# Patient Record
Sex: Male | Born: 1970 | Race: White | Hispanic: No | Marital: Single | Smoking: Former smoker
Health system: Southern US, Community
[De-identification: ages and names within clinical notes are randomized; demographics above are authoritative.]

## PROBLEM LIST (undated history)

## (undated) ENCOUNTER — Emergency Department: Discharge status: Absent without leave | Payer: BLUE CROSS/BLUE SHIELD

## (undated) DIAGNOSIS — F17200 Nicotine dependence, unspecified, uncomplicated: Secondary | ICD-10-CM

## (undated) DIAGNOSIS — F1090 Alcohol use, unspecified, uncomplicated: Secondary | ICD-10-CM

## (undated) DIAGNOSIS — K7460 Unspecified cirrhosis of liver: Secondary | ICD-10-CM

## (undated) DIAGNOSIS — K7031 Alcoholic cirrhosis of liver with ascites: Secondary | ICD-10-CM

## (undated) DIAGNOSIS — I499 Cardiac arrhythmia, unspecified: Secondary | ICD-10-CM

## (undated) DIAGNOSIS — D649 Anemia, unspecified: Secondary | ICD-10-CM

## (undated) DIAGNOSIS — F329 Major depressive disorder, single episode, unspecified: Secondary | ICD-10-CM

## (undated) DIAGNOSIS — F419 Anxiety disorder, unspecified: Secondary | ICD-10-CM

## (undated) DIAGNOSIS — I8500 Esophageal varices without bleeding: Secondary | ICD-10-CM

## (undated) DIAGNOSIS — Z8042 Family history of malignant neoplasm of prostate: Secondary | ICD-10-CM

## (undated) DIAGNOSIS — F32A Depression, unspecified: Secondary | ICD-10-CM

## (undated) DIAGNOSIS — IMO0002 Reserved for concepts with insufficient information to code with codable children: Secondary | ICD-10-CM

## (undated) DIAGNOSIS — F1910 Other psychoactive substance abuse, uncomplicated: Secondary | ICD-10-CM

## (undated) DIAGNOSIS — Z5189 Encounter for other specified aftercare: Secondary | ICD-10-CM

## (undated) HISTORY — DX: Alcoholic cirrhosis of liver with ascites: K70.31

## (undated) HISTORY — DX: Anemia, unspecified: D64.9

## (undated) HISTORY — DX: Other psychoactive substance abuse, uncomplicated: F19.10

## (undated) HISTORY — DX: Depression, unspecified: F32.A

## (undated) HISTORY — DX: Encounter for other specified aftercare: Z51.89

## (undated) HISTORY — DX: Anxiety disorder, unspecified: F41.9

## (undated) HISTORY — DX: Esophageal varices without bleeding: I85.00

## (undated) HISTORY — DX: Family history of malignant neoplasm of prostate: Z80.42

## (undated) HISTORY — DX: Reserved for concepts with insufficient information to code with codable children: IMO0002

## (undated) HISTORY — DX: Alcohol use, unspecified, uncomplicated: F10.90

## (undated) HISTORY — DX: Cardiac arrhythmia, unspecified: I49.9

## (undated) HISTORY — DX: Unspecified cirrhosis of liver: K74.60

## (undated) HISTORY — DX: Major depressive disorder, single episode, unspecified: F32.9

## (undated) HISTORY — DX: Nicotine dependence, unspecified, uncomplicated: F17.200

## (undated) HISTORY — PX: UPPER GASTROINTESTINAL ENDOSCOPY: SHX188

---
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
## 1988-04-02 HISTORY — PX: HERNIA REPAIR: SHX51

## 2010-05-02 ENCOUNTER — Emergency Department: Payer: Self-pay | Admitting: Emergency Medicine

## 2011-04-03 HISTORY — PX: HAND SURGERY: SHX662

## 2011-05-03 ENCOUNTER — Ambulatory Visit: Payer: Self-pay | Admitting: Orthopedic Surgery

## 2011-06-13 ENCOUNTER — Encounter: Payer: Self-pay | Admitting: Orthopedic Surgery

## 2011-07-02 ENCOUNTER — Encounter: Payer: Self-pay | Admitting: Orthopedic Surgery

## 2011-08-01 ENCOUNTER — Encounter: Payer: Self-pay | Admitting: Orthopedic Surgery

## 2011-09-01 ENCOUNTER — Encounter: Payer: Self-pay | Admitting: Orthopedic Surgery

## 2012-11-13 ENCOUNTER — Encounter: Payer: Self-pay | Admitting: *Deleted

## 2012-11-27 ENCOUNTER — Encounter: Payer: Self-pay | Admitting: Cardiovascular Disease

## 2012-11-27 ENCOUNTER — Ambulatory Visit (INDEPENDENT_AMBULATORY_CARE_PROVIDER_SITE_OTHER): Payer: BC Managed Care – PPO | Admitting: Cardiovascular Disease

## 2012-11-27 VITALS — BP 140/90 | HR 59 | Ht 70.0 in | Wt 142.8 lb

## 2012-11-27 DIAGNOSIS — R42 Dizziness and giddiness: Secondary | ICD-10-CM

## 2012-11-27 DIAGNOSIS — R0602 Shortness of breath: Secondary | ICD-10-CM

## 2012-11-27 DIAGNOSIS — R002 Palpitations: Secondary | ICD-10-CM

## 2012-11-27 NOTE — Assessment & Plan Note (Signed)
Todd Higgins is a 42 yo who presents with palpitations that are clearly associated with stress.  He has a very stressful job.  He notices these palpitations whenever he has to deal with employees or the public in stressful situations.  He smokes a pack of cigarettes a day. He does not get any exercise.  I've reassured him that his palpitations are benign. He he may be having some premature ventricular contractions or perhaps some premature atrial contractions. I've Advised to stop smoking which would greatly reduce his risk of developing cardiac problems. I've advised him to exercise on a regular basis. I've offered to place a monitor on him or to get an echocardiogram if he feels that he is having additional problems but this point I really don't think that he needs it. We'll see him on an as-needed basis.

## 2012-11-27 NOTE — Progress Notes (Signed)
     Todd Higgins Date of Birth  10-21-1970       [facility] Office [address], [address]  [address] [place], [place]  [address]   [place], [place]  [address] [phone]     [phone]   Fax  [phone]    Fax [phone]  Problem List: 1. Palpitations 2 anxiety  History of Present Illness:  Todd Higgins is a 42 yo who presents for evaluation of paliptations.  He has a stressful job and knows that these symptoms are due to stress.   He has "panic attacks" on occasion.    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  Notes stress with dealing with employees and with the public.   He occasionally will measure his HR and BP at the [facility] down the street and has noted elevations.    He does not get any regular exercise.    He smokes at least 1 ppd.    Current Outpatient Prescriptions  Medication Sig Dispense Refill  . escitalopram (LEXAPRO) 5 MG tablet Take 5 mg by mouth daily.        No current facility-administered medications for this visit.     No Known Allergies  Past Medical History  Diagnosis Date  . Anxiety   . Arrhythmia   . Tobacco dependence   . Family history of prostate cancer     Past Surgical History  Procedure Laterality Date  . Hernia repair    . Appendectomy    . Hand surgery      History  Smoking status  . Current Every Day Smoker -- 1.00 packs/day for 25 years  Smokeless tobacco  . Not on file    History  Alcohol Use  . Yes    Family History  Problem Relation Age of Onset  . Hypertension Mother   . Hypertension Father   . Prostate cancer Father     Reviw of Systems:  Reviewed in the HPI.  All other systems are negative.  Physical Exam: Blood pressure 140/90, pulse 59, height 5\' 10"  (1.778 m), weight 142 lb 12 oz (64.751 kg). General: Well developed, well nourished, in no acute distress.  Head:  Normocephalic, atraumatic, sclera non-icteric, mucus membranes are moist,   Neck: Supple. Carotids are 2 + without bruits. No JVD   Lungs: Clear   Heart: RR, normal S1, S2  Abdomen: Soft, non-tender, non-distended with normal bowel sounds.  Msk:  Strength and tone are normal   Extremities: No clubbing or cyanosis. No edema.  Distal pedal pulses are 2+ and equal    Neuro: CN II - XII intact.  Alert and oriented X 3.   Psych:  Normal   ECG: November 27, 2012:  Sinus bradycardia at 59.  No ST or T wave changes.   Assessment / Plan:

## 2012-11-27 NOTE — Patient Instructions (Addendum)
Your physician recommends that you continue on your current medications as directed. Please refer to the Current Medication list given to you today.   Your physician recommends that you schedule a follow-up appointment as needed  

## 2012-12-04 ENCOUNTER — Telehealth: Payer: Self-pay | Admitting: *Deleted

## 2012-12-04 NOTE — Telephone Encounter (Signed)
Patient wanting to know when he needs his cholestrol checked again. Please advise

## 2013-02-02 ENCOUNTER — Encounter: Payer: Self-pay | Admitting: *Deleted

## 2014-07-25 NOTE — Op Note (Signed)
PATIENT NAME:  Todd Higgins, Todd Higgins MR#:  [id number] DATE OF BIRTH:  1970-12-19  DATE OF PROCEDURE:  05/03/2011  PREOPERATIVE DIAGNOSIS: Left ring finger proximal phalanx fracture.   POSTOPERATIVE DIAGNOSIS: Left ring finger proximal phalanx fracture.   PROCEDURE: Open reduction internal fixation left ring finger proximal phalanx.   SURGEON: Laurene Footman, M.D.   ANESTHESIA: General.    DESCRIPTION OF PROCEDURE: The patient was brought to the Operating Room and after adequate anesthesia was obtained the left arm was prepped and draped in the usual sterile fashion with tourniquet applied to the upper arm. After patient identification and timeout procedures were completed, a mid axial incision was made on the ulnar border of the proximal phalanx and extend dorsal ulnarly with protection of the neurovascular bundle. The lateral bands were dissected off the extensor hood and the proximal portion of the phalanx was exposed. The fracture was quite difficult. There was more comminution than was apparent on preop x-rays and it was very difficult to get anatomic reduction. Eventually, however, a two hole plate was contoured to fit the ulnar side of the proximal phalanx and 2.0 mm screws placed through the phalanx to get rigid fixation without small bone reduction clamp. This was carried out. A K wire was used to help with alignment. After having achieved acceptable alignment and rigid fixation on AP and lateral projections, there did not appear to be any malrotation as there had been preoperatively. The wound was thoroughly irrigated and then anatomic repair carried out with deep closure with repair of the lateral attachment to the extensor hood with 4-0 Vicryl, 4-0 Monocryl subcutaneously, and 5-0 nylon for the skin in a running manner. Digital block was given using 10 mL of 0.5% Marcaine without epinephrine for postoperative analgesia. Sterile dressings of Xeroform, 4 x 4's, and then fluffs followed by a bias wrap  were applied and the tourniquet was let down. Tourniquet time was 107 minutes at 250 mmHg. There were no complications and no specimen.  ____________________________ Laurene Footman, MD [id number] D: 05/03/2011 21:53:22 ET T: 05/04/2011 11:08:22 ET  JOB#: [id number] Christian Mate Tifini Reeder MD ELECTRONICALLY SIGNED 05/04/2011 20:06

## 2015-11-08 ENCOUNTER — Ambulatory Visit (INDEPENDENT_AMBULATORY_CARE_PROVIDER_SITE_OTHER): Payer: BLUE CROSS/BLUE SHIELD | Admitting: Internal Medicine

## 2015-11-08 ENCOUNTER — Encounter: Payer: Self-pay | Admitting: Internal Medicine

## 2015-11-08 VITALS — BP 100/70 | HR 92 | Temp 98.0°F | Ht 69.0 in | Wt 140.0 lb

## 2015-11-08 DIAGNOSIS — Z23 Encounter for immunization: Secondary | ICD-10-CM

## 2015-11-08 DIAGNOSIS — K2920 Alcoholic gastritis without bleeding: Secondary | ICD-10-CM | POA: Diagnosis not present

## 2015-11-08 DIAGNOSIS — IMO0002 Reserved for concepts with insufficient information to code with codable children: Secondary | ICD-10-CM

## 2015-11-08 DIAGNOSIS — E441 Mild protein-calorie malnutrition: Secondary | ICD-10-CM | POA: Diagnosis not present

## 2015-11-08 DIAGNOSIS — F1099 Alcohol use, unspecified with unspecified alcohol-induced disorder: Secondary | ICD-10-CM

## 2015-11-08 DIAGNOSIS — F39 Unspecified mood [affective] disorder: Secondary | ICD-10-CM

## 2015-11-08 LAB — CBC WITH DIFFERENTIAL/PLATELET
BASOS PCT: 0.8 % (ref 0.0–3.0)
Basophils Absolute: 0.1 10*3/uL (ref 0.0–0.1)
EOS ABS: 0 10*3/uL (ref 0.0–0.7)
EOS PCT: 0 % (ref 0.0–5.0)
HEMATOCRIT: 26.5 % — AB (ref 39.0–52.0)
HEMOGLOBIN: 9.1 g/dL — AB (ref 13.0–17.0)
LYMPHS PCT: 9.6 % — AB (ref 12.0–46.0)
Lymphs Abs: 1.4 10*3/uL (ref 0.7–4.0)
MCHC: 34.2 g/dL (ref 30.0–36.0)
MCV: 93.4 fl (ref 78.0–100.0)
Monocytes Absolute: 1.8 10*3/uL — ABNORMAL HIGH (ref 0.1–1.0)
Monocytes Relative: 12.8 % — ABNORMAL HIGH (ref 3.0–12.0)
Neutro Abs: 10.9 10*3/uL — ABNORMAL HIGH (ref 1.4–7.7)
Neutrophils Relative %: 76.8 % (ref 43.0–77.0)
Platelets: 213 10*3/uL (ref 150.0–400.0)
RBC: 2.84 Mil/uL — AB (ref 4.22–5.81)
RDW: 17 % — AB (ref 11.5–15.5)
WBC: 14.2 10*3/uL — AB (ref 4.0–10.5)

## 2015-11-08 LAB — COMPREHENSIVE METABOLIC PANEL
ALBUMIN: 2.7 g/dL — AB (ref 3.5–5.2)
ALK PHOS: 172 U/L — AB (ref 39–117)
ALT: 27 U/L (ref 0–53)
AST: 100 U/L — ABNORMAL HIGH (ref 0–37)
BUN: 6 mg/dL (ref 6–23)
CALCIUM: 8.9 mg/dL (ref 8.4–10.5)
CHLORIDE: 85 meq/L — AB (ref 96–112)
CO2: 29 mEq/L (ref 19–32)
Creatinine, Ser: 0.79 mg/dL (ref 0.40–1.50)
GFR: 111.52 mL/min (ref >60.00)
Glucose, Bld: 119 mg/dL — ABNORMAL HIGH (ref 70–99)
POTASSIUM: 3.9 meq/L (ref 3.5–5.1)
Sodium: 123 mEq/L — ABNORMAL LOW (ref 135–145)
TOTAL PROTEIN: 7.3 g/dL (ref 6.0–8.3)
Total Bilirubin: 2.8 mg/dL — ABNORMAL HIGH (ref 0.2–1.2)

## 2015-11-08 LAB — T4, FREE: FREE T4: 1.5 ng/dL (ref 0.60–1.60)

## 2015-11-08 LAB — PROTIME-INR
INR: 1.6 ratio — ABNORMAL HIGH (ref 0.8–1.0)
PROTHROMBIN TIME: 16.7 s — AB (ref 9.6–13.1)

## 2015-11-08 MED ORDER — BUPROPION HCL ER (XL) 150 MG PO TB24: 150.0000 mg | ORAL_TABLET | Freq: Every day | ORAL | 3 refills | Status: DC

## 2015-11-08 MED ORDER — CHLORDIAZEPOXIDE HCL 25 MG PO CAPS: 25.0000 mg | ORAL_CAPSULE | Freq: Three times a day (TID) | ORAL | 0 refills | Status: DC | PRN

## 2015-11-08 NOTE — Progress Notes (Signed)
Subjective:    Patient ID: Todd Higgins, male    DOB: 07-Aug-1970, 45 y.o.   MRN: [id number]  HPI Here to establish care Hasn't had a primary care doctor for the most part--did see Dr Jacqualine Code at [facility] once  He is alcoholic I took care of his father Todd Higgins He worries about liver damage Did go to [employer] about 2 years ago--got sober Relapsed 2-3 weeks later Determined to stop again Has cut back-- 4 beers for 3 days straight, then 1 (to prevent shakes). Hopes to stop completely now Would typically have 8-10 beers a night Lost license due to DWI  AA in past-- not recently  Does have some abdominal swelling Sharp epigastric pains-- in past 2-3 weeks No meds for this Trying to eat Taking omeprazole also--seems to be helping (can keep food down now) No hematemesis Slight blood in stool--with diarrhea due to decreased eating. No abnormal bleeding --gums, urine, skin (no bruising) No itching  Known anxiety Put on lexapro---made him feel bad and he stopped it Lots of stress  No current outpatient prescriptions on file prior to visit.   No current facility-administered medications on file prior to visit.     No Known Allergies  Past Medical History:  Diagnosis Date  . Alcohol use disorder ([place])   . Anxiety   . Arrhythmia   . Family history of prostate cancer   . Tobacco dependence     Past Surgical History:  Procedure Laterality Date  . APPENDECTOMY  1981  . HAND SURGERY  2013  . HERNIA REPAIR  1990    Family History  Problem Relation Age of Onset  . Hypertension Mother   . Hypertension Father   . Prostate cancer Father   . Alcohol abuse Father   . Mental illness Father   . Heart disease Maternal Grandmother   . Stroke Maternal Grandfather   . Diabetes Maternal Grandfather   . Diabetes Paternal Grandmother   . Drug abuse Maternal Uncle   . Stroke Maternal Uncle   . Prostate cancer Maternal Uncle     Social History   Social History  .  Marital status: Single    Spouse name: N/A  . Number of children: 0  . Years of education: N/A   Occupational History  . 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 Smoking status: Current Every Day Smoker    Packs/day: 1.00    Years: 25.00  . Smokeless tobacco: Never Used  . Alcohol use Yes  . Drug use: No  . Sexual activity: Not on file   Other Topics Concern  . Not on file   Social History Narrative  . No narrative on file   Review of Systems  Constitutional: Positive for unexpected weight change.       Lost weight not eating  HENT: Negative for hearing loss and tinnitus.        Wax on left -- clogs up Some dental problems--no recent dental visit  Eyes:       Some vision changes--new Rx from Dr Gloriann Loan  Needs bifocals  Respiratory: Positive for cough. Negative for chest tightness and shortness of breath.   Cardiovascular: Negative for chest pain and palpitations.  Gastrointestinal: Positive for abdominal pain, blood in stool and diarrhea.  Genitourinary: Negative for dysuria and hematuria.       Slow stream at times More nocturia  Musculoskeletal: Negative for arthralgias, back  pain and joint swelling.  Skin: Negative for rash.  Allergic/Immunologic: Negative for environmental allergies and immunocompromised state.  Neurological: Positive for dizziness. Negative for syncope and light-headedness.  Psychiatric/Behavioral: The patient is nervous/anxious.        Some depression lately No thoughts of suicide Usually sleeps okay---after drinking       Objective:   Physical Exam  Constitutional: He is oriented to person, place, and time. No distress.  Widespread muscle wasting  HENT:  Mouth/Throat: Oropharynx is clear and moist. No oropharyngeal exudate.  Neck: Normal range of motion. Neck supple. No thyromegaly present.  Cardiovascular: Normal rate, regular rhythm, normal heart sounds and intact distal pulses.  Exam reveals no  gallop.   No murmur heard. HR in 90's  Pulmonary/Chest: Effort normal and breath sounds normal. No respiratory distress. He has no wheezes. He has no rales.  Abdominal:  Liver span ~10cm Mild epigastric tenderness  Musculoskeletal: He exhibits no edema or tenderness.  Lymphadenopathy:    He has no cervical adenopathy.  Neurological: He is alert and oriented to person, place, and time.  Skin: No rash noted. No erythema.  Psychiatric:  Mild psychomotor retardation No overt depression          Assessment & Plan:

## 2015-11-08 NOTE — Progress Notes (Signed)
Pre visit review using our clinic review tool, if applicable. No additional management support is needed unless otherwise documented below in the visit note. 

## 2015-11-08 NOTE — Addendum Note (Signed)
Addended by: Lurlean Nanny on: 11/08/2015 01:24 PM   Modules accepted: Orders

## 2015-11-08 NOTE — Assessment & Plan Note (Signed)
Ready to stop Will give some librium to help prevent DTs---no more alcohol AA today and get sponsor

## 2015-11-08 NOTE — Assessment & Plan Note (Signed)
Improved with the PPI Will continue Check liver function, etc

## 2015-11-08 NOTE — Patient Instructions (Signed)
Contact AA and start meetings today. You need a sponsor. No more alcohol--- use the librium if you need to for shakes Continue the ensure-- 1-2 cans a day. You can start eating more normally again. Go to the ER if you have any severe withdrawal symptoms. Continue the omeprazole daily on an empty stomach.

## 2015-11-08 NOTE — Assessment & Plan Note (Signed)
Okay to start eating regular food again Continue the ensure supplements

## 2015-11-08 NOTE — Assessment & Plan Note (Signed)
Some anxiety for years---since Dad's death Mild depression Will start bupropion to help with absinence

## 2015-11-14 ENCOUNTER — Ambulatory Visit (INDEPENDENT_AMBULATORY_CARE_PROVIDER_SITE_OTHER): Payer: BLUE CROSS/BLUE SHIELD | Admitting: Internal Medicine

## 2015-11-14 ENCOUNTER — Encounter: Payer: Self-pay | Admitting: Internal Medicine

## 2015-11-14 VITALS — BP 110/62 | HR 102 | Temp 97.7°F | Wt 149.0 lb

## 2015-11-14 DIAGNOSIS — F1099 Alcohol use, unspecified with unspecified alcohol-induced disorder: Secondary | ICD-10-CM

## 2015-11-14 DIAGNOSIS — F39 Unspecified mood [affective] disorder: Secondary | ICD-10-CM

## 2015-11-14 DIAGNOSIS — K7031 Alcoholic cirrhosis of liver with ascites: Secondary | ICD-10-CM | POA: Diagnosis not present

## 2015-11-14 DIAGNOSIS — IMO0002 Reserved for concepts with insufficient information to code with codable children: Secondary | ICD-10-CM

## 2015-11-14 DIAGNOSIS — K7460 Unspecified cirrhosis of liver: Secondary | ICD-10-CM | POA: Insufficient documentation

## 2015-11-14 MED ORDER — FUROSEMIDE 40 MG PO TABS: 40.0000 mg | ORAL_TABLET | Freq: Every day | ORAL | 0 refills | Status: DC | PRN

## 2015-11-14 NOTE — Assessment & Plan Note (Addendum)
Has been abstinent since last week Set up with AA and a sponsor Discussed--may want to reconsider going to [place] again

## 2015-11-14 NOTE — Progress Notes (Signed)
Pre visit review using our clinic review tool, if applicable. No additional management support is needed unless otherwise documented below in the visit note. 

## 2015-11-14 NOTE — Progress Notes (Signed)
   Subjective:    Patient ID: Todd Higgins, male    DOB: 20-Oct-1970, 45 y.o.   MRN: [id number]  HPI Here for follow up of alcohol abuse Has had a lot of soreness and abdominal bloating Epigastric pain (and all over abdomen)  Some swelling in feet also No SOB Lies flat in bed---no PND Nocturia every hour  Abstinent since last visit To AA 4 times last visit Now has  Did need 5 of the librium  Has been drinking gatorade---hoping electrolytes would help  Current Outpatient Prescriptions on File Prior to Visit  Medication Sig Dispense Refill  . buPROPion (WELLBUTRIN XL) 150 MG 24 hr tablet Take 1 tablet (150 mg total) by mouth daily. 30 tablet 3  . chlordiazePOXIDE (LIBRIUM) 25 MG capsule Take 1 capsule (25 mg total) by mouth 3 (three) times daily as needed for anxiety. 10 capsule 0  . omeprazole (PRILOSEC) 20 MG capsule Take 20 mg by mouth daily as needed.     No current facility-administered medications on file prior to visit.     No Known Allergies  Past Medical History:  Diagnosis Date  . Alcohol use disorder ([place])   . Anxiety   . Arrhythmia   . Family history of prostate cancer   . Tobacco dependence     Past Surgical History:  Procedure Laterality Date  . APPENDECTOMY  1981  . HAND SURGERY  2013  . HERNIA REPAIR  1990    Family History  Problem Relation Age of Onset  . Hypertension Mother   . Hypertension Father   . Prostate cancer Father   . Alcohol abuse Father   . Mental illness Father   . Heart disease Maternal Grandmother   . Stroke Maternal Grandfather   . Diabetes Maternal Grandfather   . Diabetes Paternal Grandmother   . Drug abuse Maternal Uncle   . Stroke Maternal Uncle   . Prostate cancer Maternal Uncle     Social History   Social History  . Marital status: Single    Spouse name: N/A  . Number of children: 0  . Years of education: N/A   Occupational History  . 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 Smoking status: Current Every Day Smoker    Packs/day: 1.00    Years: 25.00  . Smokeless tobacco: Never Used  . Alcohol use Yes  . Drug use: No  . Sexual activity: Not on file   Other Topics Concern  . Not on file   Social History Narrative  . No narrative on file   Review of Systems Weight up 9#--some must be fluid Appetite is okay Not sleeping well Hasn't really been able to go to work---can't lift anything Ears are stopped up No fever No heartburn    Objective:   Physical Exam  Constitutional: No distress.  Very weak--trouble getting on table  HENT:  Canals full of cerumen  Neck: No thyromegaly present.  Cardiovascular: Normal rate, regular rhythm and normal heart sounds.  Exam reveals no gallop.   No murmur heard. Pulmonary/Chest: Effort normal. No respiratory distress. He has no wheezes. He has no rales.  Dullness and decreased breath sounds at bases-- left > right  Musculoskeletal:  2+ pedal edema  Lymphadenopathy:    He has no cervical adenopathy.          Assessment & Plan:

## 2015-11-14 NOTE — Assessment & Plan Note (Signed)
Still very depressed but hard to judge Consider increasing bupropion at next follow up

## 2015-11-14 NOTE — Patient Instructions (Addendum)
Please continue the boost drinks. Avoid salt--including gatorade.. Start a multivitamin with iron every day. Take the furosemide every day that you wake with swelling in your legs and abdomen.

## 2015-11-14 NOTE — Assessment & Plan Note (Signed)
Now apparent with his excess salt intake (all the gatorade). Since no fluid overload last week, will hold off on spironolactone (will just use furosemide prn till fluid gone) Will check ultrasound to be sure no unexpected findings (clearly has ascites and some pleural effusions as well)

## 2015-11-16 ENCOUNTER — Ambulatory Visit
Admission: RE | Admit: 2015-11-16 | Discharge: 2015-11-16 | Disposition: A | Discharge status: Home / Self Care | Payer: BLUE CROSS/BLUE SHIELD | Source: Ambulatory Visit | Attending: Internal Medicine | Admitting: Internal Medicine

## 2015-11-16 DIAGNOSIS — K7031 Alcoholic cirrhosis of liver with ascites: Secondary | ICD-10-CM

## 2015-11-16 DIAGNOSIS — R188 Other ascites: Secondary | ICD-10-CM | POA: Diagnosis not present

## 2015-11-16 DIAGNOSIS — K7460 Unspecified cirrhosis of liver: Secondary | ICD-10-CM | POA: Diagnosis not present

## 2015-11-17 ENCOUNTER — Telehealth: Payer: Self-pay | Admitting: Internal Medicine

## 2015-11-17 NOTE — Telephone Encounter (Signed)
Patient Name: Todd Higgins  DOB: Nov 17, 1970    Initial Comment caller states he has fluid retention, abd bloating and constipation   Nurse Assessment  Nurse: Raphael Gibney, RN, Vanita Ingles Date/Time (Eastern Time): 11/17/2015 10:26:53 AM  Confirm and document reason for call. If symptomatic, describe symptoms. You must click the next button to save text entered. ---Caller states he has fluid retention in his abd. has edema in his feet to about half way his calves. Can't wear his shoes. Having trouble walking due to the swelling and pain. Has pain when he stands in his abd. He has been taking his furosemide. He is constipated. Had BM this am that was hard. thinks his abd swelling is bigger. Says his Librium does not seem to help his anxiety.  Has the patient traveled out of the country within the last 30 days? ---Not Applicable  Does the patient have any new or worsening symptoms? ---Yes  Will a triage be completed? ---Yes  Related visit to physician within the last 2 weeks? ---No  Does the PT have any chronic conditions? (i.e. diabetes, asthma, etc.) ---Yes  List chronic conditions. ---alcohol abuse;  Is this a behavioral health or substance abuse call? ---No     Guidelines    Guideline Title Affirmed Question Affirmed Notes  Leg Swelling and Edema [1] MODERATE leg swelling (e.g., swelling extends up to knees) AND [2] new onset or worsening    Final Disposition User   See Physician within 24 Hours [place], RN, [place]    Comments  Pt states he can not come to the office today as he has no transportation   Referrals  GO TO FACILITY REFUSED   Disagree/Comply: Disagree  Disagree/Comply Reason: Unable to find transportation

## 2015-11-17 NOTE — Telephone Encounter (Signed)
Pt has appt with Dr Silvio Pate on 11/21/15 at 2:15.

## 2015-11-17 NOTE — Telephone Encounter (Signed)
Please ask him to try 2 of the furosemide daily but he needs to still be drinking plenty of fluid. I don't think he should take any more of the librium. I can try to squeeze him in tomorrow if things aren't any better (he should try 2 of the furosemide together now--even if he took the one earlier)

## 2015-11-18 NOTE — Telephone Encounter (Signed)
Please call patient back about how he's to take his medication.  Please call patient back at [phone].

## 2015-11-18 NOTE — Telephone Encounter (Signed)
Patient called back.  I spoke to [facility] and he said to tell patient to take both pills at the same time.  Patient notified.

## 2015-11-18 NOTE — Telephone Encounter (Signed)
Pt was notified of instructions below, he said he will call back if he thinks he needs to be seen today.  He  Todd Higgins he woke up this morning at 6 am with abd pain and he had a small BM and the pain decreased, he thinks he's constipated and would like to know if he can take anything for that.

## 2015-11-21 ENCOUNTER — Ambulatory Visit (INDEPENDENT_AMBULATORY_CARE_PROVIDER_SITE_OTHER): Payer: BLUE CROSS/BLUE SHIELD | Admitting: Internal Medicine

## 2015-11-21 ENCOUNTER — Encounter: Payer: Self-pay | Admitting: Internal Medicine

## 2015-11-21 ENCOUNTER — Telehealth: Payer: Self-pay | Admitting: Internal Medicine

## 2015-11-21 DIAGNOSIS — K7031 Alcoholic cirrhosis of liver with ascites: Secondary | ICD-10-CM | POA: Diagnosis not present

## 2015-11-21 DIAGNOSIS — F39 Unspecified mood [affective] disorder: Secondary | ICD-10-CM | POA: Diagnosis not present

## 2015-11-21 MED ORDER — SPIRONOLACTONE 50 MG PO TABS: 50.0000 mg | ORAL_TABLET | Freq: Every day | ORAL | 1 refills | Status: DC

## 2015-11-21 MED ORDER — BUPROPION HCL ER (XL) 300 MG PO TB24: 300.0000 mg | ORAL_TABLET | Freq: Every day | ORAL | 2 refills | Status: DC

## 2015-11-21 NOTE — Progress Notes (Signed)
Pre visit review using our clinic review tool, if applicable. No additional management support is needed unless otherwise documented below in the visit note. 

## 2015-11-21 NOTE — Assessment & Plan Note (Signed)
Persistent fluid overload--- ascites, pleural effusions Furosemide not helping Will start spironolactone Needs emergent GI evaluation

## 2015-11-21 NOTE — Telephone Encounter (Signed)
Pt scheduled to see Amy Esterwood PA 11/28/15@1 :30pm. Please notify pt of appt.

## 2015-11-21 NOTE — Patient Instructions (Addendum)
Please hold off on the furosemide--don't take for now. Start the spironolactone daily.

## 2015-11-21 NOTE — Telephone Encounter (Signed)
Left message for patient to call and confirm this appointment. °

## 2015-11-21 NOTE — Assessment & Plan Note (Signed)
Ongoing stress Will increase the bupropion as planned Not particularly depressed

## 2015-11-21 NOTE — Progress Notes (Signed)
Subjective:    Patient ID: Todd Higgins, male    DOB: 09/10/1970, 45 y.o.   MRN: [id number]  HPI He is here for follow up of the cirrhosis Mom is with him  No fluid has come off since starting the furosemide Even with 2 together a day Feet are really swollen and painful Not SOB Feels weak though  Still abstinent Not able to make it to AA--- trouble getting around with the swelling Has kept in touch with sponsor  Still having lots of stress "I get my mine wrapped up around things, it stresses me out"  Current Outpatient Prescriptions on File Prior to Visit  Medication Sig Dispense Refill  . buPROPion (WELLBUTRIN XL) 150 MG 24 hr tablet Take 1 tablet (150 mg total) by mouth daily. 30 tablet 3  . chlordiazePOXIDE (LIBRIUM) 25 MG capsule Take 1 capsule (25 mg total) by mouth 3 (three) times daily as needed for anxiety. 10 capsule 0  . furosemide (LASIX) 40 MG tablet Take 1 tablet (40 mg total) by mouth daily as needed. For morning swelling in legs and abdomen 30 tablet 0  . omeprazole (PRILOSEC) 20 MG capsule Take 20 mg by mouth daily as needed.     No current facility-administered medications on file prior to visit.     No Known Allergies  Past Medical History:  Diagnosis Date  . Alcohol use disorder ([place])   . Anxiety   . Arrhythmia   . Family history of prostate cancer   . Tobacco dependence     Past Surgical History:  Procedure Laterality Date  . APPENDECTOMY  1981  . HAND SURGERY  2013  . HERNIA REPAIR  1990    Family History  Problem Relation Age of Onset  . Hypertension Mother   . Hypertension Father   . Prostate cancer Father   . Alcohol abuse Father   . Mental illness Father   . Heart disease Maternal Grandmother   . Stroke Maternal Grandfather   . Diabetes Maternal Grandfather   . Diabetes Paternal Grandmother   . Drug abuse Maternal Uncle   . Stroke Maternal Uncle   . Prostate cancer Maternal Uncle     Social History   Social History  .  Marital status: Single    Spouse name: N/A  . Number of children: 0  . Years of education: N/A   Occupational History  . 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 Smoking status: Current Every Day Smoker    Packs/day: 1.00    Years: 25.00  . Smokeless tobacco: Never Used  . Alcohol use Yes  . Drug use: No  . Sexual activity: Not on file   Other Topics Concern  . Not on file   Social History Narrative  . No narrative on file   Review of Systems Has rash on left lateral ankle Eating better Real food now Avoiding all salt in foods Constipation now-- using biscodyl suppository prn Trouble sleeping at night--will have urinary urgency, then not have to go. Sleeping better in recliner now Did start a daily multivitamin    Objective:   Physical Exam  Constitutional: He appears well-developed and well-nourished. No distress.  Neck: Normal range of motion. Neck supple.  Cardiovascular: Normal rate and regular rhythm.  Exam reveals no gallop.   Soft systolic murmur  Pulmonary/Chest: Effort normal. No respiratory distress. He has no wheezes. He has no rales.  Dullness  and decreased breath sounds bilaterally  Abdominal: Soft. There is no tenderness.  Musculoskeletal:  1-2+ pitting edema in ankles and feet  Lymphadenopathy:    He has cervical adenopathy.  Psychiatric: He has a normal mood and affect. His behavior is normal.          Assessment & Plan:

## 2015-11-22 ENCOUNTER — Telehealth: Payer: Self-pay | Admitting: Physician Assistant

## 2015-11-22 ENCOUNTER — Telehealth: Payer: Self-pay | Admitting: Internal Medicine

## 2015-11-22 ENCOUNTER — Telehealth: Payer: Self-pay | Admitting: *Deleted

## 2015-11-22 LAB — CBC WITH DIFFERENTIAL/PLATELET
Basophils Absolute: 0.1 10*3/uL (ref 0.0–0.1)
Basophils Relative: 0.8 % (ref 0.0–3.0)
EOS PCT: 1.5 % (ref 0.0–5.0)
Eosinophils Absolute: 0.2 10*3/uL (ref 0.0–0.7)
HCT: 25.2 % — ABNORMAL LOW (ref 39.0–52.0)
Hemoglobin: 8.5 g/dL — ABNORMAL LOW (ref 13.0–17.0)
LYMPHS ABS: 1.4 10*3/uL (ref 0.7–4.0)
Lymphocytes Relative: 9.7 % — ABNORMAL LOW (ref 12.0–46.0)
MCHC: 33.9 g/dL (ref 30.0–36.0)
MCV: 94.3 fl (ref 78.0–100.0)
MONO ABS: 1.1 10*3/uL — AB (ref 0.1–1.0)
Monocytes Relative: 7.6 % (ref 3.0–12.0)
NEUTROS ABS: 12 10*3/uL — AB (ref 1.4–7.7)
NEUTROS PCT: 80.4 % — AB (ref 43.0–77.0)
PLATELETS: 352 10*3/uL (ref 150.0–400.0)
RBC: 2.67 Mil/uL — AB (ref 4.22–5.81)
RDW: 17.3 % — AB (ref 11.5–15.5)
WBC: 15 10*3/uL — ABNORMAL HIGH (ref 4.0–10.5)

## 2015-11-22 LAB — COMPREHENSIVE METABOLIC PANEL
ALK PHOS: 116 U/L (ref 39–117)
ALT: 36 U/L (ref 0–53)
AST: 142 U/L — ABNORMAL HIGH (ref 0–37)
Albumin: 2.3 g/dL — ABNORMAL LOW (ref 3.5–5.2)
BUN: 10 mg/dL (ref 6–23)
CO2: 32 meq/L (ref 19–32)
Calcium: 7.9 mg/dL — ABNORMAL LOW (ref 8.4–10.5)
Chloride: 79 mEq/L — ABNORMAL LOW (ref 96–112)
Creatinine, Ser: 0.94 mg/dL (ref 0.40–1.50)
GFR: 91.24 mL/min (ref >60.00)
GLUCOSE: 99 mg/dL (ref 70–99)
POTASSIUM: 2.9 meq/L — AB (ref 3.5–5.1)
SODIUM: 119 meq/L — AB (ref 135–145)
TOTAL PROTEIN: 7.1 g/dL (ref 6.0–8.3)
Total Bilirubin: 3 mg/dL — ABNORMAL HIGH (ref 0.2–1.2)

## 2015-11-22 NOTE — Telephone Encounter (Signed)
I have sent a note to one of the doctors and his labs to Ms Tiajuana Amass he is seeing. Please call the GI office again for me--- I will speak to one of them if needed (give my cell #) to see if they can get him in this week

## 2015-11-22 NOTE — Telephone Encounter (Signed)
Spoke with Dr. Silvio Pate and he would like pt seen sooner than Monday. Pt scheduled to see Ellouise Newer 11/24/15@10 :45am. Pt aware of appt.

## 2015-11-22 NOTE — Telephone Encounter (Signed)
Todd Higgins Todd Higgins Can one of you help with this Thanks

## 2015-11-22 NOTE — Telephone Encounter (Signed)
[name] (mom) called pt has appointment with GI on Monday.  Mom stated pt needs to be seen sooner. Is there anything you can do to get him in this week;  Please advise pt on what he needs to do

## 2015-11-22 NOTE — Telephone Encounter (Signed)
Spoke with Lorriane Shire at [facility]. She is sending Nicoletta Ba a message to have her call you on your cell. Thanks

## 2015-11-22 NOTE — Telephone Encounter (Signed)
Artis Delay from [facility] lab called with critical sodium of 119. Dr. Silvio Pate notified.

## 2015-11-22 NOTE — Telephone Encounter (Signed)
Only down from 123---due to ascites and cirrhosis (so total body overload with sodium probably) Potassium low also--- but I have stopped the furosemide and substituted spironolactone Emergent GI consult put in---has appointment 8/28  Will need repeats then

## 2015-11-23 NOTE — Telephone Encounter (Signed)
I was called yesterday afternoon--he is getting in on Thursday

## 2015-11-24 ENCOUNTER — Telehealth: Payer: Self-pay | Admitting: Internal Medicine

## 2015-11-24 ENCOUNTER — Telehealth: Payer: Self-pay | Admitting: *Deleted

## 2015-11-24 ENCOUNTER — Encounter (HOSPITAL_COMMUNITY): Payer: Self-pay | Admitting: *Deleted

## 2015-11-24 ENCOUNTER — Ambulatory Visit (INDEPENDENT_AMBULATORY_CARE_PROVIDER_SITE_OTHER): Payer: BLUE CROSS/BLUE SHIELD | Admitting: Physician Assistant

## 2015-11-24 ENCOUNTER — Encounter: Payer: Self-pay | Admitting: Physician Assistant

## 2015-11-24 ENCOUNTER — Inpatient Hospital Stay (HOSPITAL_COMMUNITY): Payer: BLUE CROSS/BLUE SHIELD

## 2015-11-24 ENCOUNTER — Other Ambulatory Visit (INDEPENDENT_AMBULATORY_CARE_PROVIDER_SITE_OTHER): Payer: BLUE CROSS/BLUE SHIELD

## 2015-11-24 ENCOUNTER — Inpatient Hospital Stay (HOSPITAL_COMMUNITY)
Admission: AD | Admit: 2015-11-24 | Discharge: 2015-11-28 | DRG: 433 | Disposition: A | Discharge status: Home / Self Care | Payer: BLUE CROSS/BLUE SHIELD | Source: Ambulatory Visit | Attending: Internal Medicine | Admitting: Internal Medicine

## 2015-11-24 VITALS — BP 88/58 | HR 92 | Temp 98.7°F | Ht 69.0 in | Wt 153.0 lb

## 2015-11-24 DIAGNOSIS — I959 Hypotension, unspecified: Secondary | ICD-10-CM | POA: Diagnosis present

## 2015-11-24 DIAGNOSIS — K649 Unspecified hemorrhoids: Secondary | ICD-10-CM | POA: Diagnosis present

## 2015-11-24 DIAGNOSIS — D689 Coagulation defect, unspecified: Secondary | ICD-10-CM

## 2015-11-24 DIAGNOSIS — K7682 Hepatic encephalopathy: Secondary | ICD-10-CM

## 2015-11-24 DIAGNOSIS — K7460 Unspecified cirrhosis of liver: Secondary | ICD-10-CM

## 2015-11-24 DIAGNOSIS — IMO0002 Reserved for concepts with insufficient information to code with codable children: Secondary | ICD-10-CM | POA: Diagnosis present

## 2015-11-24 DIAGNOSIS — F1020 Alcohol dependence, uncomplicated: Secondary | ICD-10-CM

## 2015-11-24 DIAGNOSIS — R601 Generalized edema: Secondary | ICD-10-CM

## 2015-11-24 DIAGNOSIS — F17210 Nicotine dependence, cigarettes, uncomplicated: Secondary | ICD-10-CM | POA: Diagnosis present

## 2015-11-24 DIAGNOSIS — D684 Acquired coagulation factor deficiency: Secondary | ICD-10-CM | POA: Diagnosis present

## 2015-11-24 DIAGNOSIS — R600 Localized edema: Secondary | ICD-10-CM | POA: Diagnosis present

## 2015-11-24 DIAGNOSIS — K921 Melena: Secondary | ICD-10-CM

## 2015-11-24 DIAGNOSIS — Z823 Family history of stroke: Secondary | ICD-10-CM | POA: Diagnosis not present

## 2015-11-24 DIAGNOSIS — E8770 Fluid overload, unspecified: Secondary | ICD-10-CM | POA: Diagnosis present

## 2015-11-24 DIAGNOSIS — D638 Anemia in other chronic diseases classified elsewhere: Secondary | ICD-10-CM | POA: Diagnosis present

## 2015-11-24 DIAGNOSIS — R188 Other ascites: Secondary | ICD-10-CM

## 2015-11-24 DIAGNOSIS — F1099 Alcohol use, unspecified with unspecified alcohol-induced disorder: Secondary | ICD-10-CM | POA: Diagnosis not present

## 2015-11-24 DIAGNOSIS — D509 Iron deficiency anemia, unspecified: Secondary | ICD-10-CM | POA: Diagnosis present

## 2015-11-24 DIAGNOSIS — K7031 Alcoholic cirrhosis of liver with ascites: Secondary | ICD-10-CM | POA: Diagnosis present

## 2015-11-24 DIAGNOSIS — K7290 Hepatic failure, unspecified without coma: Secondary | ICD-10-CM | POA: Diagnosis present

## 2015-11-24 DIAGNOSIS — D72829 Elevated white blood cell count, unspecified: Secondary | ICD-10-CM

## 2015-11-24 DIAGNOSIS — K5900 Constipation, unspecified: Secondary | ICD-10-CM | POA: Diagnosis present

## 2015-11-24 DIAGNOSIS — F1021 Alcohol dependence, in remission: Secondary | ICD-10-CM | POA: Diagnosis present

## 2015-11-24 DIAGNOSIS — Z79899 Other long term (current) drug therapy: Secondary | ICD-10-CM

## 2015-11-24 DIAGNOSIS — Z833 Family history of diabetes mellitus: Secondary | ICD-10-CM

## 2015-11-24 DIAGNOSIS — Z8249 Family history of ischemic heart disease and other diseases of the circulatory system: Secondary | ICD-10-CM

## 2015-11-24 DIAGNOSIS — E441 Mild protein-calorie malnutrition: Secondary | ICD-10-CM | POA: Diagnosis present

## 2015-11-24 DIAGNOSIS — R1084 Generalized abdominal pain: Secondary | ICD-10-CM | POA: Diagnosis not present

## 2015-11-24 DIAGNOSIS — Z818 Family history of other mental and behavioral disorders: Secondary | ICD-10-CM | POA: Diagnosis not present

## 2015-11-24 DIAGNOSIS — E876 Hypokalemia: Secondary | ICD-10-CM | POA: Diagnosis present

## 2015-11-24 DIAGNOSIS — E878 Other disorders of electrolyte and fluid balance, not elsewhere classified: Secondary | ICD-10-CM

## 2015-11-24 DIAGNOSIS — D649 Anemia, unspecified: Secondary | ICD-10-CM | POA: Diagnosis not present

## 2015-11-24 DIAGNOSIS — E871 Hypo-osmolality and hyponatremia: Secondary | ICD-10-CM

## 2015-11-24 DIAGNOSIS — Z811 Family history of alcohol abuse and dependence: Secondary | ICD-10-CM | POA: Diagnosis not present

## 2015-11-24 DIAGNOSIS — F419 Anxiety disorder, unspecified: Secondary | ICD-10-CM | POA: Diagnosis present

## 2015-11-24 DIAGNOSIS — K922 Gastrointestinal hemorrhage, unspecified: Secondary | ICD-10-CM

## 2015-11-24 DIAGNOSIS — R531 Weakness: Secondary | ICD-10-CM | POA: Diagnosis present

## 2015-11-24 DIAGNOSIS — K2971 Gastritis, unspecified, with bleeding: Secondary | ICD-10-CM

## 2015-11-24 DIAGNOSIS — Z6821 Body mass index (BMI) 21.0-21.9, adult: Secondary | ICD-10-CM | POA: Diagnosis not present

## 2015-11-24 LAB — COMPREHENSIVE METABOLIC PANEL
ALT: 33 U/L (ref 0–53)
AST: 136 U/L — AB (ref 0–37)
Albumin: 2.3 g/dL — ABNORMAL LOW (ref 3.5–5.2)
Alkaline Phosphatase: 122 U/L — ABNORMAL HIGH (ref 39–117)
BILIRUBIN TOTAL: 3 mg/dL — AB (ref 0.2–1.2)
BUN: 9 mg/dL (ref 6–23)
CO2: 32 meq/L (ref 19–32)
Calcium: 7.8 mg/dL — ABNORMAL LOW (ref 8.4–10.5)
Chloride: 75 mEq/L — ABNORMAL LOW (ref 96–112)
Creatinine, Ser: 0.89 mg/dL (ref 0.40–1.50)
GFR: 97.17 mL/min (ref >60.00)
GLUCOSE: 92 mg/dL (ref 70–99)
Potassium: 3.2 mEq/L — ABNORMAL LOW (ref 3.5–5.1)
SODIUM: 114 meq/L — AB (ref 135–145)
TOTAL PROTEIN: 7 g/dL (ref 6.0–8.3)

## 2015-11-24 LAB — CBC WITH DIFFERENTIAL/PLATELET
Basophils Absolute: 0 10*3/uL (ref 0.0–0.1)
Basophils Relative: 0.2 % (ref 0.0–3.0)
EOS ABS: 0.1 10*3/uL (ref 0.0–0.7)
EOS PCT: 0.3 % (ref 0.0–5.0)
LYMPHS PCT: 9.4 % — AB (ref 12.0–46.0)
Lymphs Abs: 1.6 10*3/uL (ref 0.7–4.0)
MCHC: 34.5 g/dL (ref 30.0–36.0)
MCV: 93.6 fl (ref 78.0–100.0)
MONO ABS: 1.6 10*3/uL — AB (ref 0.1–1.0)
Monocytes Relative: 9.5 % (ref 3.0–12.0)
NEUTROS PCT: 80.6 % — AB (ref 43.0–77.0)
Neutro Abs: 13.6 10*3/uL — ABNORMAL HIGH (ref 1.4–7.7)
PLATELETS: 369 10*3/uL (ref 150.0–400.0)
RDW: 17 % — ABNORMAL HIGH (ref 11.5–15.5)
WBC: 16.8 10*3/uL — AB (ref 4.0–10.5)

## 2015-11-24 LAB — PROTIME-INR
INR: 1.6 ratio — ABNORMAL HIGH (ref 0.8–1.0)
Prothrombin Time: 17.3 s — ABNORMAL HIGH (ref 9.6–13.1)

## 2015-11-24 LAB — BASIC METABOLIC PANEL
ANION GAP: 11 (ref 5–15)
ANION GAP: 8 (ref 5–15)
BUN: 10 mg/dL (ref 6–20)
BUN: 10 mg/dL (ref 6–20)
CALCIUM: 8.2 mg/dL — AB (ref 8.9–10.3)
CHLORIDE: 78 mmol/L — AB (ref 101–111)
CO2: 30 mmol/L (ref 22–32)
CO2: 31 mmol/L (ref 22–32)
Calcium: 7.6 mg/dL — ABNORMAL LOW (ref 8.9–10.3)
Chloride: 75 mmol/L — ABNORMAL LOW (ref 101–111)
Creatinine, Ser: 0.88 mg/dL (ref 0.61–1.24)
Creatinine, Ser: 0.93 mg/dL (ref 0.61–1.24)
GFR calc Af Amer: >60 mL/min (ref >60)
GFR calc Af Amer: >60 mL/min (ref >60)
GFR calc non Af Amer: >60 mL/min (ref >60)
GFR calc non Af Amer: >60 mL/min (ref >60)
GLUCOSE: 86 mg/dL (ref 65–99)
GLUCOSE: 94 mg/dL (ref 65–99)
POTASSIUM: 2.8 mmol/L — AB (ref 3.5–5.1)
Potassium: 3.2 mmol/L — ABNORMAL LOW (ref 3.5–5.1)
Sodium: 116 mmol/L — CL (ref 135–145)
Sodium: 117 mmol/L — CL (ref 135–145)

## 2015-11-24 LAB — BODY FLUID CELL COUNT WITH DIFFERENTIAL
EOS FL: 0 %
Lymphs, Fluid: 8 %
MONOCYTE-MACROPHAGE-SEROUS FLUID: 90 % (ref 50–90)
Neutrophil Count, Fluid: 2 % (ref 0–25)
Total Nucleated Cell Count, Fluid: 114 cu mm (ref 0–1000)

## 2015-11-24 LAB — MRSA PCR SCREENING: MRSA by PCR: NEGATIVE

## 2015-11-24 LAB — LACTATE DEHYDROGENASE, PLEURAL OR PERITONEAL FLUID: LD FL: 36 U/L — AB (ref 3–23)

## 2015-11-24 LAB — AMMONIA: AMMONIA: 22 umol/L (ref 9–35)

## 2015-11-24 LAB — GLUCOSE, SEROUS FLUID: GLUCOSE FL: 88 mg/dL

## 2015-11-24 LAB — GRAM STAIN

## 2015-11-24 LAB — PROTEIN, BODY FLUID

## 2015-11-24 LAB — TSH: TSH: 3.707 u[IU]/mL (ref 0.350–4.500)

## 2015-11-24 MED ORDER — MIDAZOLAM HCL 2 MG/2ML IJ SOLN
INTRAMUSCULAR | Status: AC
  Filled 2015-11-24: qty 2

## 2015-11-24 MED ORDER — NICOTINE 14 MG/24HR TD PT24
14.0000 mg | MEDICATED_PATCH | Freq: Every day | TRANSDERMAL | Status: DC
  Administered 2015-11-24 – 2015-11-28 (×5): 14 mg via TRANSDERMAL
  Filled 2015-11-24 (×5): qty 1

## 2015-11-24 MED ORDER — PHYTONADIONE 5 MG PO TABS
5.0000 mg | ORAL_TABLET | Freq: Once | ORAL | Status: AC
  Administered 2015-11-24: 5 mg via ORAL
  Filled 2015-11-24: qty 1

## 2015-11-24 MED ORDER — SODIUM CHLORIDE 0.9% FLUSH
3.0000 mL | Freq: Two times a day (BID) | INTRAVENOUS | Status: DC
  Administered 2015-11-24 – 2015-11-28 (×8): 3 mL

## 2015-11-24 MED ORDER — ALBUMIN HUMAN 25 % IV SOLN
50.0000 g | Freq: Once | INTRAVENOUS | Status: AC
  Administered 2015-11-24: 50 g via INTRAVENOUS
  Filled 2015-11-24: qty 50

## 2015-11-24 MED ORDER — LACTULOSE 10 GM/15ML PO SOLN
10.0000 g | Freq: Three times a day (TID) | ORAL | Status: DC
  Administered 2015-11-24 (×2): 10 g via ORAL
  Filled 2015-11-24 (×2): qty 15

## 2015-11-24 MED ORDER — FUROSEMIDE 10 MG/ML IJ SOLN
40.0000 mg | Freq: Every day | INTRAMUSCULAR | Status: DC
  Administered 2015-11-24 – 2015-11-27 (×4): 40 mg via INTRAVENOUS
  Filled 2015-11-24 (×4): qty 4

## 2015-11-24 MED ORDER — PANTOPRAZOLE SODIUM 40 MG PO TBEC
40.0000 mg | DELAYED_RELEASE_TABLET | Freq: Two times a day (BID) | ORAL | Status: DC
  Administered 2015-11-24 – 2015-11-28 (×8): 40 mg via ORAL
  Filled 2015-11-24 (×8): qty 1

## 2015-11-24 MED ORDER — POTASSIUM CHLORIDE CRYS ER 20 MEQ PO TBCR
40.0000 meq | EXTENDED_RELEASE_TABLET | Freq: Two times a day (BID) | ORAL | Status: DC
  Administered 2015-11-24: 40 meq via ORAL
  Filled 2015-11-24: qty 2

## 2015-11-24 MED ORDER — DEXTROSE 5 % IV SOLN
2.0000 g | Freq: Every day | INTRAVENOUS | Status: DC
  Administered 2015-11-24: 2 g via INTRAVENOUS
  Filled 2015-11-24 (×2): qty 2

## 2015-11-24 MED ORDER — POTASSIUM CHLORIDE CRYS ER 20 MEQ PO TBCR
40.0000 meq | EXTENDED_RELEASE_TABLET | Freq: Once | ORAL | Status: AC
  Administered 2015-11-24: 40 meq via ORAL
  Filled 2015-11-24: qty 2

## 2015-11-24 NOTE — Procedures (Signed)
Ultrasound-guided diagnostic and therapeutic paracentesis performed yielding 4 liters of clear, yellow fluid. No immediate complications. A portion of the fluid was submitted to the laboratory for preordered studies. Since this was patient's initial paracentesis only the above amount of fluid was removed at this time.

## 2015-11-24 NOTE — Patient Instructions (Signed)
Please go to the basement level to have your labs drawn.  

## 2015-11-24 NOTE — H&P (Addendum)
History and Physical    Todd Higgins [id number] DOB: 02-Jul-1970 DOA: 11/24/2015  PCP: Viviana Simpler, MD  Outpatient Specialists: GI, D Patient coming from: home  Chief Complaint: weakness  HPI: Todd Higgins is a 45 y.o. male with medical history significant of alcohol abuse (quit 11/07/15), recently diagnosed liver cirrhosis on 8/7 as he sought care for abdominal swelling, he was started on diuretics to help with that however they have not been working and continued weight gain, he was also found to be hyponatremic with a sodium of 123 at the beginning of August, which decreased to 118, he continued to gain weight, and he presented today to gastroenterology office for emergent evaluation, he was found to be weak, borderline hypotensive, and blood work revealed worsening sodium 114, he had lower extremity edema as well as tense abdominal swelling, and he was directed for admission to [facility] hospital. Patient complains of lower extremity swelling, progressive over the last month, and a 13 pound weight gain over the last 1-2 weeks. He also complains of abdominal pain and abdominal swelling. He denies any fever or chills. He denies any nausea or vomiting. He did have emesis but he was drinking at that time, since he stopped drinking he has not been having any further vomiting. He complains of constipation, very difficult to use the bathroom, and occasionally he sees bright red blood with his bowel movements. He also complains of decreased urination over the last few weeks. Patient's mother, who is at bedside, states that he has been "slower" than usual, no overt confusion but she has noticed some changes in his mental status.  Review of Systems: As per HPI otherwise 10 point review of systems negative.   Past Medical History:  Diagnosis Date  . Alcohol use disorder ([place])   . Anxiety   . Arrhythmia   . Family history of prostate cancer   . Tobacco dependence     Past Surgical History:    Procedure Laterality Date  . APPENDECTOMY  1981  . HAND SURGERY  2013  . [place]     reports that he has been smoking.  He has a 25.00 pack-year smoking history. He has never used smokeless tobacco. He reports that he drinks alcohol. He reports that he does not use drugs.  No Known Allergies  Family History  Problem Relation Age of Onset  . Hypertension Mother   . Hypertension Father   . Prostate cancer Father   . Alcohol abuse Father   . Mental illness Father   . Heart disease Maternal Grandmother   . Stroke Maternal Grandfather   . Diabetes Maternal Grandfather   . Diabetes Paternal Grandmother   . Drug abuse Maternal Uncle   . Stroke Maternal Uncle   . Prostate cancer Maternal Uncle     Prior to Admission medications   Medication Sig Start Date End Date Taking? Authorizing Provider  buPROPion (WELLBUTRIN XL) 300 MG 24 hr tablet Take 1 tablet (300 mg total) by mouth daily. 11/21/15   Venia Carbon, MD  furosemide (LASIX) 40 MG tablet Take 1 tablet (40 mg total) by mouth daily as needed. For morning swelling in legs and abdomen 11/14/15   Venia Carbon, MD  omeprazole (PRILOSEC) 20 MG capsule Take 20 mg by mouth daily as needed.    Historical Provider, MD  spironolactone (ALDACTONE) 50 MG tablet Take 1 tablet (50 mg total) by mouth daily. 11/21/15   Venia Carbon, MD  Physical Exam: There were no vitals filed for this visit.    Constitutional: Cachectic ill-appearing Caucasian male There were no vitals filed for this visit. Eyes: PERRL, mild scleral icterus  ENMT: Mucous membranes are moist.  Respiratory: clear to auscultation bilaterally, no wheezing, no crackles. Normal respiratory effort. No accessory muscle use.  Cardiovascular: Regular rate and rhythm, no murmurs / rubs / gallops. 2+ pitting LE  edema. 2+ pedal pulses.  Abdomen:  abdomen distended, tender to palpation throughout, ascites present  Musculoskeletal: no clubbing / cyanosis.   Skin: no rashes, lesions, ulcers. No induration Neurologic:  nonfocal, no asterixis  Psychiatric: Normal judgment and insight. Alert and oriented x 3. Normal mood.   Labs on Admission: I have personally reviewed following labs and imaging studies  CBC:  Recent Labs Lab 11/21/15 1525 11/24/15 1146  WBC 15.0* 16.8*  NEUTROABS 12.0* 13.6*  HGB 8.5 Repeated and verified X2.* 8.4 Repeated and verified X2.*  HCT 25.2 Repeated and verified X2.* 24.3 Repeated and verified X2.*  MCV 94.3 93.6  PLT 352.0 123456   Basic Metabolic Panel:  Recent Labs Lab 11/21/15 1525 11/24/15 1146  NA 119* 114*  K 2.9* 3.2*  CL 79* 75*  CO2 32 32  GLUCOSE 99 92  BUN 10 9  CREATININE 0.94 0.89  CALCIUM 7.9* 7.8*   GFR: Estimated Creatinine Clearance: 100.7 mL/min (by C-G formula based on SCr of 0.89 mg/dL). Liver Function Tests:  Recent Labs Lab 11/21/15 1525 11/24/15 1146  AST 142* 136*  ALT 36 33  ALKPHOS 116 122*  BILITOT 3.0* 3.0*  PROT 7.1 7.0  ALBUMIN 2.3* 2.3*   No results for input(s): LIPASE, AMYLASE in the last 168 hours. No results for input(s): AMMONIA in the last 168 hours. Coagulation Profile:  Recent Labs Lab 11/24/15 1146  INR 1.6*   Cardiac Enzymes: No results for input(s): CKTOTAL, CKMB, CKMBINDEX, TROPONINI in the last 168 hours. BNP (last 3 results) No results for input(s): PROBNP in the last 8760 hours. HbA1C: No results for input(s): HGBA1C in the last 72 hours. CBG: No results for input(s): GLUCAP in the last 168 hours. Lipid Profile: No results for input(s): CHOL, HDL, LDLCALC, TRIG, CHOLHDL, LDLDIRECT in the last 72 hours. Thyroid Function Tests: No results for input(s): TSH, T4TOTAL, FREET4, T3FREE, THYROIDAB in the last 72 hours. Anemia Panel: No results for input(s): VITAMINB12, FOLATE, FERRITIN, TIBC, IRON, RETICCTPCT in the last 72 hours. Urine analysis: No results found for: COLORURINE, APPEARANCEUR, LABSPEC, PHURINE, GLUCOSEU, HGBUR,  BILIRUBINUR, KETONESUR, PROTEINUR, UROBILINOGEN, NITRITE, LEUKOCYTESUR Sepsis Labs: @LABRCNTIP (procalcitonin:4,lacticidven:4) )No results found for this or any previous visit (from the past 240 hour(s)).   Radiological Exams on Admission: No results found.  EKG: Independently reviewed. pending  Assessment/Plan Active Problems:   Alcohol use disorder (H[facility])   Malnutrition of mild degree (HCC)   Decompensated hepatic cirrhosis (HCC)   Coagulopathy (HCC)   Anemia   GI bleed   Hyponatremia   Hypotension   Anasarca   Constipation   Encephalopathy, hepatic (HCC)   Decompensated cirrhosis  - MELD-Na score of 25 - Patient critically ill, will admit to step down closely monitor    Anasarca/ascites  - Patient's blood pressure on arrival is 110s/80s, will provide patient with IV Lasix - Strict I's and O's, daily weights - TED hose in bilateral lower extremities - Ultrasound guided paracentesis, looks like he will get it done today, will administer albumin, if positive for SBP will need repeat albumin dose 8/26  Hyponatremia  -  likely due to fluid overload, provide IV Lasix x 1, re-evaluate in am based on BP and renal function, closely monitor BMP every 4 hours to prevent rapid shifts  Concern for GI bleeding / anemia - Anemia as a component of liver disease, also with occasional GI bleed, no melena however he has seen bright red blood per rectum, he thinks it's related to hemorrhoids  - Gastroneurology will consult on patient,   Leukocytosis  - With abdominal pain concern for spontaneous bacterial peritonitis, ultrasound as above, start ceftriaxone    Mild hepatic encephalopathy  - per mother has patient has been slower than normal, provide lactulose, this will help with her constipation as well  Hypotension - in GI office, BP is better here, use appropriate cuff  Alcohol abuse, in remission - quit 2 weeks ago  Tobacco abuse - counseled, nicotine patch  Coagulopathy -  INR 1.6, provide Vitamin K x 1  Hypokalemia - replete, keep an eye on it since using IV Lasix.     DVT prophylaxis: SCD  Code Status: Full code  Family Communication: d/w mother bedside Disposition Plan: TBD Consults called: GI  Admission status: inpatient    Marzetta Board, MD [facility] Pager [phone]  If 7PM-7AM, please contact night-coverage [email or web address] Password [email or web address]  11/24/2015, 2:01 PM

## 2015-11-24 NOTE — Telephone Encounter (Signed)
Advised the patient's mother Romie Minus [facility] signed) per orders Dr/ Havery Moros and Ellouise Newer PA-C, he will need to be admitted to [facility].  The labs results are worse and we need to care for him at the hospital.  He is to go to the front door of [facility] and go to admitting. The admitting MD, is Dr. Marzetta Board, [facility].

## 2015-11-24 NOTE — Progress Notes (Signed)
Agree with assessment and plan as outlined. Patient with new diagnosis of cirrhosis, suspect due to alcoholism, but would recommend chronic liver disease labs at some point in time to ensure no other cause. He has had worsening ascites recently with worsening electrolyte abnormalities. He now has severe hyponatremia, with leukocytosis.   Agree with inpatient admission for management of hyponatremia, repletion of K, large volume paracentesis to rule out SBP, albumin infusion if more than 5L removed, and further diuresis as electrolytes allow. Needs < 2gm Na diet, and needs EGD and colonoscopy at some point in time for anemia / rectal bleeding evaluation and varices screening. AFP level should be done if not done recently. He can follow up with me after his inpatient stay.

## 2015-11-24 NOTE — Progress Notes (Addendum)
Chief Complaint:Cirrhosis with ascites  HPI:  Todd Higgins is a 45 y/o Caucasian male who was urgently referred to me by Dr. Viviana Simpler for a complaint of worsening cirrhosis with ascites and electrolyte abnormalities .    According to chart review patient was initially seen by a primary care on 11/08/15 to establish care. He described that he was an alcoholic and he was worried about liver damage. At that time he was concerned about some abdominal swelling as well as pain and blood in his stool. Patient was started on bupropion and instructed to join AA, labs were also drawn. INR on that day was elevated at 1.6, CMP showed a decreased sodium at 123, total bilirubin was increased at 2.8, alkaline phosphatase increased at 172 and AST increased at 100 and albumin was low at 2.7. CBC showed an elevated white count of 14.2 and a decreased hemoglobin 9.1. T4 was normal.  Abdominal ultrasound was completed on 11/16/15 with findings of ascites throughout the peritoneal cavity, regular contours of the gallbladder with thickened wall most likely due to gallbladder adenomatosis and possibly hypoproteinemia. Changes of cirrhosis with echogenic nodular liver. No focal hepatic abnormality was seen. The pancreas was not well visualized.  Patient was then seen on 11/21/15 with continued increase of fluid regardless of furosemide. Legs were swollen. CMP on that day showed a low sodium at 119, potassium low at 2.9 and chloride low at 79, bilirubin had increased to 3.0, AST had increased to 142, albumin continued to be low at 2.3. CBC showed an increasing white count now a 15 and a hemoglobin continuing to decrease now 8.5 from 9.1 previously. Patient was told to stop Lasix 80 mg and Spironolactone 50 mg per day was started and emergent GI evaluation was recommended.  Today, the patient presents to clinic accompanied by his mother. He explains that he has been drinking at least 10 beers a day for 4-5 years. He stopped  cold [place] on 11/07/15. Since then the patient tells me that he has had an increasing of abdominal distention as well as fluid in his legs and other symptoms. He initially went to see his primary care physician 2 weeks ago and was started on furosemide 40 mg once daily. He tells me that this did not do anything for the 3 days that he took it and it was increased to 80 mg once daily on the 14th of this month. This continued to not help and the patient was instructed to stop this medication and start spironolactone 50 mg on Monday of this week due to recent lab finding of decreased sodium. The patient does admit to some generalized abdominal pain which he rates at a 9-10 out of 10 at times, worse with movement. He tells me he can no longer get comfortable and has not been able to sleep for the past 2 weeks.  Patient also describes episodes of nausea and vomiting 2 weeks ago. The patient was started on Prilosec 20 mg at that time and has had no further vomiting or heartburn.  Patient also describes constipation noting that he will go 3-4 days without a bowel movement and when he has one, he has to "strain a lot and they all are small and hard". His last bowel movement was 3 days ago. He does describe seeing bright red blood with all of his bowel movements over the past 2 weeks.  Patient also tells me that it has been hard for him to urinate over this  time, noting that he cannot hardly initiate a stream.  Patient also has gained 13 pounds over the past 2 weeks. He does not feel like the diuretics are working at all.  Patient also tells me today that he has become quite "shaky and weak". He denies any confusion or mental instability. His mother agrees.  Patient's family history is positive for his father dying from alcoholic cirrhosis. The patient is very concerned about this.  Patient denies any fever, chills, hematemesis, history of IV drug use, history of tattoos, anorexia or history of hepatitis.  Past  Medical History:  Diagnosis Date  . Alcohol use disorder ([place])   . Anxiety   . Arrhythmia   . Family history of prostate cancer   . Tobacco dependence     Past Surgical History:  Procedure Laterality Date  . APPENDECTOMY  1981  . HAND SURGERY  2013  . HERNIA REPAIR  1990    Current Outpatient Prescriptions  Medication Sig Dispense Refill  . buPROPion (WELLBUTRIN XL) 300 MG 24 hr tablet Take 1 tablet (300 mg total) by mouth daily. 30 tablet 2  . furosemide (LASIX) 40 MG tablet Take 1 tablet (40 mg total) by mouth daily as needed. For morning swelling in legs and abdomen 30 tablet 0  . omeprazole (PRILOSEC) 20 MG capsule Take 20 mg by mouth daily as needed.    Marland Kitchen spironolactone (ALDACTONE) 50 MG tablet Take 1 tablet (50 mg total) by mouth daily. 30 tablet 1   No current facility-administered medications for this visit.     Allergies as of 11/24/2015  . (No Known Allergies)    Family History  Problem Relation Age of Onset  . Hypertension Mother   . Hypertension Father   . Prostate cancer Father   . Alcohol abuse Father   . Mental illness Father   . Heart disease Maternal Grandmother   . Stroke Maternal Grandfather   . Diabetes Maternal Grandfather   . Diabetes Paternal Grandmother   . Drug abuse Maternal Uncle   . Stroke Maternal Uncle   . Prostate cancer Maternal Uncle     Social History   Social History  . Marital status: Single    Spouse name: N/A  . Number of children: 0  . Years of education: N/A   Occupational History  . 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 Smoking status: Current Every Day Smoker    Packs/day: 1.00    Years: 25.00  . Smokeless tobacco: Never Used  . Alcohol use Yes  . Drug use: No  . Sexual activity: Not on file   Other Topics Concern  . Not on file   Social History Narrative  . No narrative on file    Review of Systems:    Constitutional: Positive for weight gain of 13 pounds  over the past 2 weeks, weakness and fatigue No fever or chills HEENT: Eyes: Positive for yellow sclera No change in vision                Ears, Nose, Throat:  No change in hearing or congestion Skin: Positive for jaundice No rash or itching Cardiovascular: Positive for bilateral edema No chest pain, chest pressure or palpitations    Respiratory: Positive for dyspnea on exertion No cough Gastrointestinal: See HPI and otherwise negative Genitourinary: Positive for decrease in urinary frequency No dysuria  Neurological: No headache, dizziness or syncope Musculoskeletal: Positive for back  pain No muscle or joint pain Hematologic: Positive for anemia and hematochezia  Psychiatric: Positive for anxiety and depression    Physical Exam:  Vital signs: BP (!) 88/58   Pulse 92   Ht 5\' 9"  (1.753 m)   Wt 153 lb (69.4 kg)   BMI 22.59 kg/m  Temp: 98.7 deg F  General:  Jaundiced, ill appearing Caucasian male appears to be in mild distress Head:  Normocephalic and atraumatic. Eyes:   Icteric PEERL, EOMI. Conjunctiva pink. Ears:  Normal auditory acuity. Neck:  Supple Throat: Lips with spots of visible erythema and sores Oral cavity and pharynx without inflammation, swelling or lesion.  Lungs: Respirations even and unlabored. Lungs clear to auscultation bilaterally.   No wheezes, crackles, or rhonchi. Decreased breath sounds bilaterally Heart: Normal S1, S2. Soft systolic murmur. Regular rate and rhythm. Bilateral pitting peripheral edema up to the level of the shins, pallor No cyanosis  Abdomen:  Tense, marked distention, generalized moderate TTP, decreased bowel sounds, telangiectasias, shifting dullness to percussion No rebound or guarding. No appreciable masses or hepatomegaly. Rectal:  Not performed.  Msk:  Symmetrical without gross deformities. Peripheral pulses decreased, difficulty ambulating, slow and shaky Extremities: No deformity or joint abnormality. Normal ROM Neurologic:  Alert and   oriented x3;  grossly normal neurologically. No asterixis Skin:   Jaundiced Dry and intact  Psychiatric: Oriented to person, place and time. Demonstrates good judgement and reason without abnormal affect or behaviors. Short-term memory impairment  MOST RECENT LABS AND IMAGING: CBC    Component Value Date/Time   WBC 15.0 (H) 11/21/2015 1525   RBC 2.67 (L) 11/21/2015 1525   HGB 8.5 Repeated and verified X2. (L) 11/21/2015 1525   HCT 25.2 Repeated and verified X2. (L) 11/21/2015 1525   PLT 352.0 11/21/2015 1525   MCV 94.3 11/21/2015 1525   MCHC 33.9 11/21/2015 1525   RDW 17.3 (H) 11/21/2015 1525   LYMPHSABS 1.4 11/21/2015 1525   MONOABS 1.1 (H) 11/21/2015 1525   EOSABS 0.2 11/21/2015 1525   BASOSABS 0.1 11/21/2015 1525    CMP     Component Value Date/Time   NA 119 (LL) 11/21/2015 1525   K 2.9 (L) 11/21/2015 1525   CL 79 (L) 11/21/2015 1525   CO2 32 11/21/2015 1525   GLUCOSE 99 11/21/2015 1525   BUN 10 11/21/2015 1525   CREATININE 0.94 11/21/2015 1525   CALCIUM 7.9 (L) 11/21/2015 1525   PROT 7.1 11/21/2015 1525   ALBUMIN 2.3 (L) 11/21/2015 1525   AST 142 (H) 11/21/2015 1525   ALT 36 11/21/2015 1525   ALKPHOS 116 11/21/2015 1525   BILITOT 3.0 (H) 11/21/2015 1525   Imaging EXAM: ABDOMEN ULTRASOUND COMPLETE  COMPARISON:  None.  FINDINGS: Gallbladder: The gallbladder is visualized but it is not well distended. The gallbladder does appear to be slightly thick walled and there is ring down artifact consistent with gallbladder adenomyomatosis. There is no pain over the gallbladder and the gallbladder thickening may be due to adenomyomatosis and possibly hypoproteinemia. Correlate clinically.  Common bile duct: Diameter: The common bile duct is normal measuring 3.5 mm.  Liver: The liver is diffusely echogenic and difficult to penetrate with nodular contours consistent with changes of cirrhosis. No focal hepatic abnormality is seen.  IVC: No abnormality  visualized.  Pancreas: The pancreas is not well seen due to overlying bowel gas.  Spleen: The spleen measures 6.9 cm.  Right Kidney: Length: 11.1 cm.  No hydronephrosis is seen.  Left Kidney: Length: 10.4 cm.  No hydronephrosis is noted.  Abdominal aorta: The abdominal aorta is not well seen due to bowel gas.  Other findings: There is ascites noted throughout the peritoneal cavity.  IMPRESSION: 1. Ascites throughout the peritoneal cavity. 2. Irregular contours of the gallbladder with thickened wall most likely due to gallbladder adenomyomatosis and possibly hypoproteinemia. There is no pain over the gallbladder. 3. Changes of cirrhosis with echogenic nodular liver. No focal hepatic abnormality is seen. 4. The pancreas is not well visualized.   Electronically Signed   By: Ivar Drape M.D.   On: 11/16/2015 10:13  Assessment: 1. Cirrhosis with ascites: Newly diagnosed via ultrasound on 11/16/2015 and fluid buildup over the past 2 weeks. This is thought to be due to alcohol abuse, patient does describe drinking at least 10 beers per day over the past 4-5 years. He "quit cold [place]" on 11/07/2015. Will need to further workup other possible causes of cirrhosis in the future. 2. Generalized abdominal pain: Patient describes generalized abdominal pain which he rates as a 9-10/10 at times, worse with movement, worse over the past 2 weeks, with leukocytosis concern for SBP 3. Anemia: Patient describes hematochezia over the past 2 weeks, hemoglobin dropped from 9.1 two weeks ago to 8.5 three days ago, rechecking today; consider hemorrhoids from straining versus variceal bleeding versus other including possible varices 4. Hematochezia: See above, for the past 2 weeks 5. Constipation: For the past 2 weeks, only has a bowel movement every 3-4 days after a lot of straining, likely related to increasing intra-abdominal pressure from ascites 6. Hyponatremia: Sodium steadily dropping, 2  weeks ago was low at 123 and 3 days ago low at 119, patient's diuretics have been changed around since then, furosemide 80 mg a day was stopped and spironolactone 50 mg once daily was started, likely this has caused a worsening appearance of patient's hyponatremia from cirrhosis 7. Hypokalemia: Also related to cirrhosis 8. Leukocytosis: Patient with steadily increasing white count over the past 11 days, increased from 14.2-15 3 days ago, rechecking today, concern for SBP given increasing ascites and abdominal pain 9. Coagulopathy: Related to cirrhosis, most recent INR on 11/08/15 increased at 1.6, rechecking today 10. Hyperbilirubinemia: Likely related to cirrhosis, last at 3.0, elevated from 11 days ago 2.8 11. Hypochloremia: Decreasing from a low of 85 two weeks ago 279 3 days ago. Likely related to worsening cirrhosis and ascites. 12. Alcoholism: 10 beers per day for 4-5 yrs, quit cold [place] on 11/07/15-now maintaining abstinence, per pt  Plan: 1. Discussed above case with Dr. Havery Moros at the time of patient's appointment. 2. Ordered stat CMP, CBC and INR. According to these results we will decide if patient needs to be admitted today for correction of his electrolytes as well as further workup of his ascites including therapeutic paracentesis and further liver labs. Discussed this with the family. 3. If labs have not worsened over the past 3 days, we will order large volume paracentesis with fluid sent for cytology, cell count and differential, protein, albumin, Gram stain and culture. Will need to give albumin. 4. According to above labs we will also decide which diuretics patient needs to continue on for now. May also need to prescribe potassium supplementation. 5. Discussed constipation, likely this is secondary to ascites and abdominal pressure, patient may use MiraLAX up to 4 times a day could also consider lactulose. 6. Patient will need labs to include hepatitis panel, iron studies, alpha-1  antitrypsin, AMA, anti- Smooth muscle antibody, ceruloplasmin and baseline ammonia. 7.  Recommend he continue to maintain a low sodium diet, less than 2 g per day. We did discuss this in detail. 8. Recommend the patient document daily weights. 9. Briefly discussed with the patient that he will likely need further workup of his anemia including EGD and colonoscopy. This will need to be scheduled in the near future. EGD also for variceal screening. 10. Patient should continue his omeprazole 20 mg once daily, 30-60 minutes before eating. 11. Maintain alcohol abstinence. 12. Patient will need abdominal imaging every 6 months and AFP for HCC screening 13. We will notify the patient as soon as lab results are received on next steps of his care today.  Greater than 90 minutes was spent in consultation and coordination of this patient's care today  Ellouise Newer, PA-C [facility] 11/24/2015, 11:16 AM  Cc: Todd Carbon, MD   Addendum: Pt labs returned with worsening sodium now 114, Chloride 75, WBC up to 16.8. Patient will be directly admitted to the hospital for electrolyte replacement, management of diuretics, further eval of possible chronic liver diseases which could have contributed to cirrhosis and for emergent paracentesis with albumin replacement and fluid for analysis including cell count/diff, gram stain, culture, albumin, protein and cytology-CONCERN for SBP.  Dr. Cruzita Lederer admitting.

## 2015-11-24 NOTE — Telephone Encounter (Signed)
Called by gastroenterology office regarding Todd Higgins, 45 year old male with recent diagnosis of liver cirrhosis with ascites, with difficulties  with fluid overload, abdominal distention, concern for ascites, was seen today in office, patient had blood work done which showed a sodium of 114, from prior values were on 123 at the beginning of August, his abdomen is distended and will need ultrasound-guided paracentesis, will need diuretics for fluid overload and hyponatremia, he is borderline hypotensive which is not unexpected given his condition, GI thinks he will be able to tolerate paracentesis and diuresis. Accepted to step down, gastroenterology will consult   RN, on patient arrival, please call [phone] or [phone] and let patient placement RN know of the patient's arrival and a hospitalist will be assigned to admit the patient.    Costin M. Cruzita Lederer, MD [facility] [phone]

## 2015-11-25 ENCOUNTER — Encounter (HOSPITAL_COMMUNITY)
Admission: AD | Disposition: A | Discharge status: Home / Self Care | Payer: Self-pay | Source: Ambulatory Visit | Attending: Internal Medicine

## 2015-11-25 DIAGNOSIS — R188 Other ascites: Secondary | ICD-10-CM

## 2015-11-25 DIAGNOSIS — E871 Hypo-osmolality and hyponatremia: Secondary | ICD-10-CM

## 2015-11-25 DIAGNOSIS — E441 Mild protein-calorie malnutrition: Secondary | ICD-10-CM

## 2015-11-25 DIAGNOSIS — K921 Melena: Secondary | ICD-10-CM

## 2015-11-25 DIAGNOSIS — D689 Coagulation defect, unspecified: Secondary | ICD-10-CM

## 2015-11-25 DIAGNOSIS — K5901 Slow transit constipation: Secondary | ICD-10-CM

## 2015-11-25 DIAGNOSIS — K7290 Hepatic failure, unspecified without coma: Secondary | ICD-10-CM

## 2015-11-25 DIAGNOSIS — R601 Generalized edema: Secondary | ICD-10-CM

## 2015-11-25 LAB — COMPREHENSIVE METABOLIC PANEL
ALBUMIN: 1.9 g/dL — AB (ref 3.5–5.0)
ALK PHOS: 103 U/L (ref 38–126)
ALT: 30 U/L (ref 17–63)
AST: 123 U/L — AB (ref 15–41)
Anion gap: 9 (ref 5–15)
BILIRUBIN TOTAL: 2.2 mg/dL — AB (ref 0.3–1.2)
BUN: 9 mg/dL (ref 6–20)
CALCIUM: 7.8 mg/dL — AB (ref 8.9–10.3)
CO2: 30 mmol/L (ref 22–32)
Chloride: 79 mmol/L — ABNORMAL LOW (ref 101–111)
Creatinine, Ser: 0.85 mg/dL (ref 0.61–1.24)
GFR calc Af Amer: >60 mL/min (ref >60)
GFR calc non Af Amer: >60 mL/min (ref >60)
GLUCOSE: 89 mg/dL (ref 65–99)
Potassium: 3.1 mmol/L — ABNORMAL LOW (ref 3.5–5.1)
SODIUM: 118 mmol/L — AB (ref 135–145)
TOTAL PROTEIN: 5.8 g/dL — AB (ref 6.5–8.1)

## 2015-11-25 LAB — BASIC METABOLIC PANEL
ANION GAP: 11 (ref 5–15)
ANION GAP: 9 (ref 5–15)
ANION GAP: 7 (ref 5–15)
BUN: 10 mg/dL (ref 6–20)
BUN: 8 mg/dL (ref 6–20)
BUN: 8 mg/dL (ref 6–20)
CALCIUM: 7.9 mg/dL — AB (ref 8.9–10.3)
CHLORIDE: 75 mmol/L — AB (ref 101–111)
CHLORIDE: 82 mmol/L — AB (ref 101–111)
CHLORIDE: 83 mmol/L — AB (ref 101–111)
CO2: 31 mmol/L (ref 22–32)
CO2: 31 mmol/L (ref 22–32)
CO2: 31 mmol/L (ref 22–32)
Calcium: 7.9 mg/dL — ABNORMAL LOW (ref 8.9–10.3)
Calcium: 7.8 mg/dL — ABNORMAL LOW (ref 8.9–10.3)
Creatinine, Ser: 0.84 mg/dL (ref 0.61–1.24)
Creatinine, Ser: 0.94 mg/dL (ref 0.61–1.24)
Creatinine, Ser: 0.96 mg/dL (ref 0.61–1.24)
GFR calc Af Amer: >60 mL/min (ref >60)
GFR calc non Af Amer: >60 mL/min (ref >60)
GLUCOSE: 105 mg/dL — AB (ref 65–99)
Glucose, Bld: 100 mg/dL — ABNORMAL HIGH (ref 65–99)
Glucose, Bld: 113 mg/dL — ABNORMAL HIGH (ref 65–99)
POTASSIUM: 2.6 mmol/L — AB (ref 3.5–5.1)
POTASSIUM: 2.8 mmol/L — AB (ref 3.5–5.1)
POTASSIUM: 2.9 mmol/L — AB (ref 3.5–5.1)
SODIUM: 122 mmol/L — AB (ref 135–145)
SODIUM: 121 mmol/L — AB (ref 135–145)
Sodium: 117 mmol/L — CL (ref 135–145)

## 2015-11-25 LAB — TRIGLYCERIDES, BODY FLUIDS: Triglycerides, Fluid: 18 mg/dL

## 2015-11-25 LAB — FERRITIN: FERRITIN: 33 ng/mL (ref 24–336)

## 2015-11-25 LAB — CBC
HCT: 18.9 % — ABNORMAL LOW (ref 39.0–52.0)
Hemoglobin: 7 g/dL — ABNORMAL LOW (ref 13.0–17.0)
MCH: 32.3 pg (ref 26.0–34.0)
MCHC: 37 g/dL — AB (ref 30.0–36.0)
MCV: 87.1 fL (ref 78.0–100.0)
Platelets: 289 10*3/uL (ref 150–400)
RBC: 2.17 MIL/uL — ABNORMAL LOW (ref 4.22–5.81)
RDW: 16.7 % — AB (ref 11.5–15.5)
WBC: 14.9 10*3/uL — ABNORMAL HIGH (ref 4.0–10.5)

## 2015-11-25 LAB — IRON AND TIBC
IRON: 15 ug/dL — AB (ref 45–182)
SATURATION RATIOS: 7 % — AB (ref 17.9–39.5)
TIBC: 216 ug/dL — ABNORMAL LOW (ref 250–450)
UIBC: 201 ug/dL

## 2015-11-25 LAB — MAGNESIUM: Magnesium: 1.6 mg/dL — ABNORMAL LOW (ref 1.7–2.4)

## 2015-11-25 LAB — PROTIME-INR
INR: 1.56
PROTHROMBIN TIME: 18.8 s — AB (ref 11.4–15.2)

## 2015-11-25 SURGERY — EGD (ESOPHAGOGASTRODUODENOSCOPY)
Anesthesia: Moderate Sedation

## 2015-11-25 MED ORDER — POLYETHYLENE GLYCOL 3350 17 G PO PACK
17.0000 g | PACK | Freq: Every day | ORAL | Status: DC
  Administered 2015-11-25: 17 g via ORAL

## 2015-11-25 MED ORDER — MAGNESIUM SULFATE 2 GM/50ML IV SOLN
2.0000 g | Freq: Once | INTRAVENOUS | Status: AC
  Administered 2015-11-25: 2 g via INTRAVENOUS
  Filled 2015-11-25: qty 50

## 2015-11-25 MED ORDER — POLYETHYLENE GLYCOL 3350 17 G PO PACK
17.0000 g | PACK | Freq: Two times a day (BID) | ORAL | Status: DC
  Filled 2015-11-25: qty 1

## 2015-11-25 MED ORDER — LACTULOSE 10 GM/15ML PO SOLN
30.0000 g | Freq: Once | ORAL | Status: AC
  Administered 2015-11-25: 30 g via ORAL
  Filled 2015-11-25: qty 45

## 2015-11-25 MED ORDER — SPIRONOLACTONE 25 MG PO TABS
50.0000 mg | ORAL_TABLET | Freq: Two times a day (BID) | ORAL | Status: DC
  Administered 2015-11-25 – 2015-11-26 (×4): 50 mg via ORAL
  Filled 2015-11-25 (×4): qty 2

## 2015-11-25 MED ORDER — POTASSIUM CHLORIDE CRYS ER 20 MEQ PO TBCR
40.0000 meq | EXTENDED_RELEASE_TABLET | Freq: Once | ORAL | Status: AC
  Administered 2015-11-25: 40 meq via ORAL
  Filled 2015-11-25: qty 2

## 2015-11-25 NOTE — Progress Notes (Signed)
PROGRESS NOTE    Todd Higgins  [id number] DOB: 06-May-1970 DOA: 11/24/2015  PCP: Viviana Simpler, MD   Brief Narrative:  45 y/o male with h/o alcohol abuse who quit drinking in 11/07/15, resultant cirrhosis with ascites who was recently started on diuretics. Despite diuretics, he is gaining fluid in his abdomen and legs.  He presents to the hospital from the GI office for weakness and is found to have a sodium of 118. Previous sodium 123.  Subjective: Feels slightly better. No abdominal pain, vomiting, diarrhea. Feels that ankle swelling is improving.   Assessment & Plan:   Principal Problem:  Hyponatremia   -due to fluid overload- follow with diuresis -  will continue Lasix, add aldactone, replace K and watch for hyperkalemia  Decompensated hepatic cirrhosis (likely alcoholic) with ascites and pedal edema - s/p paracentesis yesterday on 4 L - watch for re accumulation - liver serologies and AFP ordered by GI  Leukocytosis/ low grade temps (99) - follow- no SBP noted- no other signs of infection  Mild hypotension - follow with diuresis  Ascites and abdominal pain - ascitic fluid negative for SBP- d/c Rocephin  Hypokalemia and hypomagnesemia - replacing   GI bleed  - c/o blood streaked stools- ? Hemorrhoidal  - GI following- no need to to endoscopic procedures at this time pr GI  Anemia - f/u anemia panel in AM  Coagulopathy - likely from cirrhosis- Vit K given    Alcohol use disorder -admits to have quit drinking  Constipation - Lactulose given- cont Miralax daily-  follow  Nicotine abuse - cessation recommended- Nicoderm patch   DVT prophylaxis: SCDs Code Status: Full code Family Communication:  Disposition Plan: home in 2-3 days once diuresed  Consultants:   GI Procedures:   Paracentesis 8/24 Antimicrobials:  Anti-infectives    Start     Dose/Rate Route Frequency Ordered Stop   11/24/15 1430  cefTRIAXone (ROCEPHIN) 2 g in dextrose 5 % 50 mL  IVPB  Status:  Discontinued     2 g 100 mL/hr over 30 Minutes Intravenous Daily 11/24/15 1359 11/25/15 0813       Objective: Vitals:   11/25/15 0200 11/25/15 0400 11/25/15 0600 11/25/15 0800  BP: (!) 110/44 (!) 95/51 107/60 112/90  Pulse:      Resp: (!) 22 19 (!) 23 (!) 27  Temp:  99.2 F (37.3 C)  99 F (37.2 C)  TempSrc:  Oral  Oral  SpO2: 96% 96% 94% 95%  Weight:  62.5 kg (137 lb 12.6 oz)    Height:        Intake/Output Summary (Last 24 hours) at 11/25/15 1156 Last data filed at 11/25/15 1142  Gross per 24 hour  Intake              300 ml  Output             2525 ml  Net            -2225 ml   Filed Weights   11/24/15 1445 11/25/15 0400  Weight: 68.2 kg (150 lb 5.7 oz) 62.5 kg (137 lb 12.6 oz)    Examination: General exam: Appears comfortable  HEENT: PERRLA, oral mucosa moist, no sclera icterus or thrush Respiratory system: Clear to auscultation. Respiratory effort normal. Cardiovascular system: S1 & S2 heard, RRR.  No murmurs  Gastrointestinal system: Abdomen soft, non-tender,  Distended, tympanic to precussion, Normal bowel sound. No organomegaly Central nervous system: Alert and oriented. No focal neurological deficits. Extremities:  No cyanosis, clubbing - 2 + pitting edema Skin: No rashes or ulcers Psychiatry:  Mood & affect appropriate.     Data Reviewed: I have personally reviewed following labs and imaging studies  CBC:  Recent Labs Lab 11/21/15 1525 11/24/15 1146 11/25/15 0321  WBC 15.0* 16.8* 14.9*  NEUTROABS 12.0* 13.6*  --   HGB 8.5 Repeated and verified X2.* 8.4 Repeated and verified X2.* 7.0*  HCT 25.2 Repeated and verified X2.* 24.3 Repeated and verified X2.* 18.9*  MCV 94.3 93.6 87.1  PLT 352.0 369.0 [id number]   Basic Metabolic Panel:  Recent Labs Lab 11/24/15 1146 11/24/15 1434 11/24/15 1743 11/24/15 2216 11/25/15 0321 11/25/15 0334  NA 114* 116* 117* 117* 118*  --   K 3.2* 3.2* 2.6* 2.8* 3.1*  --   CL 75* 75* 75* 78* 79*  --     CO2 32 30 31 31 30   --   GLUCOSE 92 86 105* 94 89  --   BUN 9 10 10 10 9   --   CREATININE 0.89 0.88 0.84 0.93 0.85  --   CALCIUM 7.8* 8.2* 7.9* 7.6* 7.8*  --   MG  --   --   --   --   --  1.6*   GFR: Estimated Creatinine Clearance: 95 mL/min (by C-G formula based on SCr of 0.85 mg/dL). Liver Function Tests:  Recent Labs Lab 11/21/15 1525 11/24/15 1146 11/25/15 0321  AST 142* 136* 123*  ALT 36 33 30  ALKPHOS 116 122* 103  BILITOT 3.0* 3.0* 2.2*  PROT 7.1 7.0 5.8*  ALBUMIN 2.3* 2.3* 1.9*   No results for input(s): LIPASE, AMYLASE in the last 168 hours.  Recent Labs Lab 11/24/15 1434  AMMONIA 22   Coagulation Profile:  Recent Labs Lab 11/24/15 1146 11/25/15 0321  INR 1.6* 1.56   Cardiac Enzymes: No results for input(s): CKTOTAL, CKMB, CKMBINDEX, TROPONINI in the last 168 hours. BNP (last 3 results) No results for input(s): PROBNP in the last 8760 hours. HbA1C: No results for input(s): HGBA1C in the last 72 hours. CBG: No results for input(s): GLUCAP in the last 168 hours. Lipid Profile: No results for input(s): CHOL, HDL, LDLCALC, TRIG, CHOLHDL, LDLDIRECT in the last 72 hours. Thyroid Function Tests:  Recent Labs  11/24/15 1742  TSH 3.707   Anemia Panel: No results for input(s): VITAMINB12, FOLATE, FERRITIN, TIBC, IRON, RETICCTPCT in the last 72 hours. Urine analysis: No results found for: COLORURINE, APPEARANCEUR, LABSPEC, PHURINE, GLUCOSEU, HGBUR, BILIRUBINUR, KETONESUR, PROTEINUR, UROBILINOGEN, NITRITE, LEUKOCYTESUR Sepsis Labs: @LABRCNTIP (procalcitonin:4,lacticidven:4) ) Recent Results (from the past 240 hour(s))  MRSA PCR Screening     Status: None   Collection Time: 11/24/15  1:50 PM  Result Value Ref Range Status   MRSA by PCR NEGATIVE NEGATIVE Final    Comment:        The GeneXpert MRSA Assay (FDA approved for NASAL specimens only), is one component of a comprehensive MRSA colonization surveillance program. It is not intended to diagnose  MRSA infection nor to guide or monitor treatment for MRSA infections.   Gram stain     Status: None   Collection Time: 11/24/15  3:10 PM  Result Value Ref Range Status   Specimen Description FLUID PERITONEAL  Final   Special Requests NONE  Final   Gram Stain   Final    CYTOSPIN SMEAR WBC PRESENT, PREDOMINANTLY MONONUCLEAR NO ORGANISMS SEEN Performed at [facility]    Report Status 11/24/2015 FINAL  Final  Radiology Studies: US Paracentesis  Result Date: 11/25/2015 INDICATION: Alcoholic cirrhosis, ascites; request made for diagnostic and therapeutic paracentesis. EXAM: ULTRASOUND GUIDED DIAGNOSTIC AND THERAPEUTIC PARACENTESIS MEDICATIONS: None. COMPLICATIONS: None immediate. PROCEDURE: Informed written consent was obtained from the patient after a discussion of the risks, benefits and alternatives to treatment. A timeout was performed prior to the initiation of the procedure. Initial ultrasound scanning demonstrates a large amount of ascites within the right mid to lower abdominal quadrant. The right mid to lower abdomen was prepped and draped in the usual sterile fashion. 1% lidocaine was used for local anesthesia. Following this, a Yueh catheter was introduced. An ultrasound image was saved for documentation purposes. The paracentesis was performed. The catheter was removed and a dressing was applied. The patient tolerated the procedure well without immediate post procedural complication. FINDINGS: A total of approximately 4 liters of clear, yellow fluid was removed. A portion of the fluid was submitted to the lab for preordered studies. Since this was the patient's initial paracentesis only the above amount of fluid was removed at this time. IMPRESSION: Successful ultrasound-guided diagnostic and therapeutic paracentesis yielding 4 liters of peritoneal fluid. Read by: Rowe Robert, PA-C Electronically Signed   By: Marybelle Killings M.D.   On: 11/24/2015 15:34      Scheduled  Meds: . furosemide  40 mg Intravenous Daily  . nicotine  14 mg Transdermal Daily  . pantoprazole  40 mg Oral BID  . polyethylene glycol  17 g Oral BID  . sodium chloride flush  3 mL Intracatheter Q12H  . spironolactone  50 mg Oral BID   Continuous Infusions:    LOS: 1 day    Time spent in minutes: 54    [place], MD [facility] Pager: [email or web address] Password [facility] 11/25/2015, 11:56 AM

## 2015-11-25 NOTE — Progress Notes (Signed)
[facility] Progress Note  Subjective:  Feels better today after 4 Liter paracentesis yesterday.  Complaining about the SCD's hurting his legs.  Wants to eat.    Objective:  Vital signs in last 24 hours: Temp:  [97.5 F (36.4 C)-99.2 F (37.3 C)] 99.2 F (37.3 C) (08/25 0400) Pulse Rate:  [90-92] 90 (08/24 1455) Resp:  [14-27] 27 (08/25 0800) BP: (88-113)/(44-90) 112/90 (08/25 0800) SpO2:  [94 %-100 %] 95 % (08/25 0800) Weight:  [137 lb 12.6 oz (62.5 kg)-153 lb (69.4 kg)] 137 lb 12.6 oz (62.5 kg) (08/25 0400) Last BM Date:  (pta) General:  Alert, chronically ill-appearing, in NAD Heart:  Regular rate and rhythm; no murmurs Pulm: CTAB.  No W/R/R. Abdomen:  Soft, slightly distended with fluid.  BS present.  Non-tender. Extremities:  2+ LE pitting edema. Neurologic:  Alert and oriented x 4;  grossly normal neurologically. Psych:  Alert and cooperative. Normal mood and affect.  Intake/Output from previous day: 08/24 0701 - 08/25 0700 In: 250 [IV Piggyback:250] Out: 2175 [Urine:2175] Intake/Output this shift: Total I/O In: 50 [IV Piggyback:50] Out: -   Lab Results:  Recent Labs  11/24/15 1146 11/25/15 0321  WBC 16.8* 14.9*  HGB 8.4 Repeated and verified X2.* 7.0*  HCT 24.3 Repeated and verified X2.* 18.9*  PLT 369.0 289   BMET  Recent Labs  11/24/15 1743 11/24/15 2216 11/25/15 0321  NA 117* 117* 118*  K 2.6* 2.8* 3.1*  CL 75* 78* 79*  CO2 31 31 30   GLUCOSE 105* 94 89  BUN 10 10 9   CREATININE 0.84 0.93 0.85  CALCIUM 7.9* 7.6* 7.8*   LFT  Recent Labs  11/25/15 0321  PROT 5.8*  ALBUMIN 1.9*  AST 123*  ALT 30  ALKPHOS 103  BILITOT 2.2*   PT/INR  Recent Labs  11/24/15 1146 11/25/15 0321  LABPROT 17.3* 18.8*  INR 1.6* 1.56   Assessment / Plan: 1. Cirrhosis with ascites: Newly diagnosed via ultrasound on 11/16/2015 and fluid buildup over the past 2 weeks. This is thought to be due to alcohol abuse, patient does describe  drinking at least 10 beers per day over the past 4-5 years. He "quit cold [place]" on 11/07/2015. 2. Generalized abdominal pain: Patient described severe generalized abdominal pain.  Improved post paracentesis with 4 Liters of fluid removed.  No SBP. 3. Anemia: Patient describes hematochezia over the past 2 weeks, hemoglobin trending down but no sign of active bleeding. 4. Hematochezia: Intermittent bright red blood for the past two weeks.  No bleeding since admission.  Patient thinks that he has hemorrhoids. 5. Constipation: For the past 2 weeks, only has a bowel movement every 3-4 days after a lot of straining. 6. Hyponatremia: Sodium remains low.  Diuretics probably contributing to this. 7. Hypokalemia: Also related to cirrhosis. 8. Leukocytosis: Down slightly today.  Tap negative for SBP. 9. Coagulopathy: From cirrhosis.   10. Hyperbilirubinemia:  From cirrhosis. 12. Alcoholism: 10 beers per day for 4-5 yrs, quit cold [place] on 11/07/15-now maintaining abstinence, per pt  -Will check AFP along with extensive liver serologies to rule out other causes of chronic liver disease. -We are going to post-pone his EGD for now since there is no sign of active bleeding currently and he has a lot of other issues to be addressed first.  Will need colonoscopy at some point as well. -Will allow him 2 gram sodium diet today. -? If we should discontinue his diuretics (lasix 40 mg daily and  spironolactone 100 mg daily) as these may be contributing to his hyponatremia.   -? If we should get nephrology involved for help with fluid management in the setting of hyponatremia. -Daily weights and monitor I's and O's. -Continue pantoprazole 40 mg BID for now. -Miralax BID to start for constipation. -Replace K+ per primary service. -Monitor Hgb and would transfuse if it drops any further.   LOS: 1 day   ZEHR, JESSICA D.  11/25/2015, 9:09 AM  Pager number [phone]     Attending physician's note   I have taken  an interval history, reviewed the chart and examined the patient. I agree with the Advanced Practitioner's note, impression and recommendations.  Cirrhosis with ascites. Strongly suspect etoh cirrhosis however checking all standard hepatic serologies and AFP. 4 liter paracentesis yesterday without SBP noted. Severe hyponatremia (Na=118 today) and volume overload on lasix and aldactone. Trend CBC, CMP, daily weights. Hospitalists to decide if renal consult would be helpful.  Continue Miralax bid for constipation and can decrease dose as needed.  Intermittent hematochezia and anemia- colonoscopy and EGD at some point to further evaluate and to screen for varices.  We will plan to see again on Monday. Please call if assistance needed over weekend.   Lucio Edward, MD [facility] [phone] Mon-Fri 8a-5p [phone] after 5p, weekends, holidays

## 2015-11-25 NOTE — Progress Notes (Signed)
11/25/15  1240  Lab called stating the body fluid that was sent the other day, the test for bilirubin was not done. MD notified.

## 2015-11-26 LAB — CBC
HCT: 21.2 % — ABNORMAL LOW (ref 39.0–52.0)
HEMOGLOBIN: 7.4 g/dL — AB (ref 13.0–17.0)
MCH: 30.7 pg (ref 26.0–34.0)
MCHC: 34.9 g/dL (ref 30.0–36.0)
MCV: 88 fL (ref 78.0–100.0)
Platelets: 306 10*3/uL (ref 150–400)
RBC: 2.41 MIL/uL — AB (ref 4.22–5.81)
RDW: 16.9 % — ABNORMAL HIGH (ref 11.5–15.5)
WBC: 17.8 10*3/uL — ABNORMAL HIGH (ref 4.0–10.5)

## 2015-11-26 LAB — IRON AND TIBC
Iron: 27 ug/dL — ABNORMAL LOW (ref 45–182)
Saturation Ratios: 12 % — ABNORMAL LOW (ref 17.9–39.5)
TIBC: 217 ug/dL — AB (ref 250–450)
UIBC: 190 ug/dL

## 2015-11-26 LAB — BASIC METABOLIC PANEL
Anion gap: 8 (ref 5–15)
Anion gap: 7 (ref 5–15)
BUN: 8 mg/dL (ref 6–20)
BUN: 7 mg/dL (ref 6–20)
CALCIUM: 7.7 mg/dL — AB (ref 8.9–10.3)
CHLORIDE: 83 mmol/L — AB (ref 101–111)
CO2: 29 mmol/L (ref 22–32)
CO2: 31 mmol/L (ref 22–32)
CREATININE: 0.88 mg/dL (ref 0.61–1.24)
CREATININE: 0.93 mg/dL (ref 0.61–1.24)
Calcium: 7.6 mg/dL — ABNORMAL LOW (ref 8.9–10.3)
Chloride: 83 mmol/L — ABNORMAL LOW (ref 101–111)
Glucose, Bld: 101 mg/dL — ABNORMAL HIGH (ref 65–99)
Glucose, Bld: 116 mg/dL — ABNORMAL HIGH (ref 65–99)
Potassium: 2.6 mmol/L — CL (ref 3.5–5.1)
Potassium: 2.9 mmol/L — ABNORMAL LOW (ref 3.5–5.1)
SODIUM: 120 mmol/L — AB (ref 135–145)
SODIUM: 121 mmol/L — AB (ref 135–145)

## 2015-11-26 LAB — HEPATITIS PANEL, ACUTE
HCV Ab: <0.1 s/co ratio (ref 0.0–0.9)
HEP A IGM: NEGATIVE
HEP B C IGM: NEGATIVE
Hepatitis B Surface Ag: NEGATIVE

## 2015-11-26 LAB — ABO/RH: ABO/RH(D): O POS

## 2015-11-26 LAB — MAGNESIUM: MAGNESIUM: 1.9 mg/dL (ref 1.7–2.4)

## 2015-11-26 LAB — PROTIME-INR
INR: 1.63
PROTHROMBIN TIME: 19.5 s — AB (ref 11.4–15.2)

## 2015-11-26 LAB — HEMOGLOBIN AND HEMATOCRIT, BLOOD
HEMATOCRIT: 28 % — AB (ref 39.0–52.0)
HEMOGLOBIN: 10 g/dL — AB (ref 13.0–17.0)

## 2015-11-26 LAB — CERULOPLASMIN: CERULOPLASMIN: 15.2 mg/dL — AB (ref 16.0–31.0)

## 2015-11-26 LAB — RETICULOCYTES
RBC.: 2.41 MIL/uL — ABNORMAL LOW (ref 4.22–5.81)
Retic Count, Absolute: 113.3 10*3/uL (ref 19.0–186.0)
Retic Ct Pct: 4.7 % — ABNORMAL HIGH (ref 0.4–3.1)

## 2015-11-26 LAB — VITAMIN B12: VITAMIN B 12: 1413 pg/mL — AB (ref 180–914)

## 2015-11-26 LAB — FERRITIN: FERRITIN: 27 ng/mL (ref 24–336)

## 2015-11-26 LAB — PREPARE RBC (CROSSMATCH)

## 2015-11-26 LAB — AFP TUMOR MARKER: AFP-Tumor Marker: 2 ng/mL (ref 0.0–8.3)

## 2015-11-26 LAB — FOLATE: FOLATE: 14.1 ng/mL (ref >5.9)

## 2015-11-26 LAB — IGA: IGA: 1341 mg/dL — AB (ref 90–386)

## 2015-11-26 MED ORDER — POTASSIUM CHLORIDE CRYS ER 20 MEQ PO TBCR
40.0000 meq | EXTENDED_RELEASE_TABLET | ORAL | Status: AC
  Administered 2015-11-26 (×2): 40 meq via ORAL
  Filled 2015-11-26 (×2): qty 2

## 2015-11-26 MED ORDER — FERROUS SULFATE 325 (65 FE) MG PO TABS
325.0000 mg | ORAL_TABLET | Freq: Two times a day (BID) | ORAL | Status: DC
  Administered 2015-11-26 – 2015-11-28 (×4): 325 mg via ORAL
  Filled 2015-11-26 (×4): qty 1

## 2015-11-26 MED ORDER — POLYETHYLENE GLYCOL 3350 17 G PO PACK
17.0000 g | PACK | Freq: Every day | ORAL | Status: DC | PRN
  Administered 2015-11-27 – 2015-11-28 (×2): 17 g via ORAL
  Filled 2015-11-26 (×2): qty 1

## 2015-11-26 MED ORDER — SODIUM CHLORIDE 0.9 % IV SOLN
Freq: Once | INTRAVENOUS | Status: AC
  Administered 2015-11-26: 10:00:00 via INTRAVENOUS

## 2015-11-26 MED ORDER — POTASSIUM CHLORIDE CRYS ER 20 MEQ PO TBCR: 40.0000 meq | EXTENDED_RELEASE_TABLET | ORAL | Status: DC

## 2015-11-26 NOTE — Progress Notes (Signed)
CRITICAL VALUE ALERT  Critical value received:  K+ 2.6  Date of notification:  11/26/2015  Time of notification:  0357  Critical value read back: yes  Nurse who received alert:  Dyann Ruddle, RN  MD notified (1st page): Triad , NP  Time of first page:  [phone]   MD notified (2nd page):  Time of second page:  Responding MD:  Triad, NP  Time MD responded: [phone]

## 2015-11-26 NOTE — Progress Notes (Addendum)
PROGRESS NOTE    Todd Higgins  [id number] DOB: 01-Aug-1970 DOA: 11/24/2015  PCP: Viviana Simpler, MD   Brief Narrative:  45 y/o male with h/o alcohol abuse who quit drinking in 11/07/15, resultant cirrhosis with ascites who was recently started on diuretics. Despite diuretics, he is gaining fluid in his abdomen and legs.  He presents to the hospital from the GI office for weakness and is found to have a sodium of 118. Previous sodium 123.  Subjective: Abdominal pain has resolved. No cough, congestion, chest pain, nausea, vomiting, diarrhea or dysuria.   Assessment & Plan:   Principal Problem:  Hyponatremia  / hypokalemia -due to fluid overload- slowly improving with diuresis -  will continue Lasix, aldactone and replace K    Decompensated hepatic cirrhosis (likely alcoholic) with ascites and pedal edema - s/p paracentesis of 4 L - watching for re accumulation - liver serologies and AFP ordered by GI- Ig A significantly elevated, AFP normal  Leukocytosis/ low grade temps (99) - follow- no SBP noted- no other signs of infection  Mild hypotension - follow with diuresis  Ascites and abdominal pain - ascitic fluid negative for SBP- d/c Rocephin  Hypokalemia and hypomagnesemia - replacing   GI bleed  - c/o blood streaked stools- ? Hemorrhoidal  - GI following- no need to do endoscopic procedures at this time pr GI  Anemia- of chronic disease, Iron deficiency and possible chronic blood loss  - anemia panel reveals iron deficiency-retic count mildly elevated -  will give 1 u PRBC today for Hb of 7.4 and will start oral Iron- watch for constipation  Coagulopathy - likely from cirrhosis- Vit K given- no improvement in INR    Alcohol use disorder -admits to have quit drinking  Constipation - Lactulose given with good results- cont Miralax daily PRN-  follow  Nicotine abuse - cessation recommended- Nicoderm patch   DVT prophylaxis: SCDs Code Status: Full code Family  Communication:  Disposition Plan: home in 2-3 days once diuresed  Consultants:   GI Procedures:   Paracentesis 8/24 Antimicrobials:  Anti-infectives    Start     Dose/Rate Route Frequency Ordered Stop   11/24/15 1430  cefTRIAXone (ROCEPHIN) 2 g in dextrose 5 % 50 mL IVPB  Status:  Discontinued     2 g 100 mL/hr over 30 Minutes Intravenous Daily 11/24/15 1359 11/25/15 0813       Objective: Vitals:   11/26/15 0400 11/26/15 0429 11/26/15 0500 11/26/15 0800  BP: (!) 109/58     Pulse:      Resp: 18     Temp:  99.1 F (37.3 C)  98.3 F (36.8 C)  TempSrc:  Oral  Oral  SpO2: 96%     Weight:   63.1 kg (139 lb 1.8 oz)   Height:        Intake/Output Summary (Last 24 hours) at 11/26/15 1024 Last data filed at 11/26/15 0526  Gross per 24 hour  Intake               25 ml  Output              800 ml  Net             -775 ml   Filed Weights   11/24/15 1445 11/25/15 0400 11/26/15 0500  Weight: 68.2 kg (150 lb 5.7 oz) 62.5 kg (137 lb 12.6 oz) 63.1 kg (139 lb 1.8 oz)    Examination: General exam: Appears comfortable  HEENT:  PERRLA, oral mucosa moist, no sclera icterus or thrush Respiratory system: Clear to auscultation. Respiratory effort normal. Cardiovascular system: S1 & S2 heard, RRR.  No murmurs  Gastrointestinal system: Abdomen soft, non-tender,  Distended, tympanic to precussion, Normal bowel sound. No organomegaly Central nervous system: Alert and oriented. No focal neurological deficits. Extremities: No cyanosis, clubbing - 2 + pitting edema has resolved  Skin: No rashes or ulcers Psychiatry:  Mood & affect appropriate.     Data Reviewed: I have personally reviewed following labs and imaging studies  CBC:  Recent Labs Lab 11/21/15 1525 11/24/15 1146 11/25/15 0321 11/26/15 0309  WBC 15.0* 16.8* 14.9* 17.8*  NEUTROABS 12.0* 13.6*  --   --   HGB 8.5 Repeated and verified X2.* 8.4 Repeated and verified X2.* 7.0* 7.4*  HCT 25.2 Repeated and verified X2.* 24.3  Repeated and verified X2.* 18.9* 21.2*  MCV 94.3 93.6 87.1 88.0  PLT 352.0 369.0 289 [id number]   Basic Metabolic Panel:  Recent Labs Lab 11/24/15 2216 11/25/15 0321 11/25/15 0334 11/25/15 1138 11/25/15 1942 11/26/15 0309 11/26/15 0818  NA 117* 118*  --  122* 121* 120*  --   K 2.8* 3.1*  --  2.8* 2.9* 2.6*  --   CL 78* 79*  --  82* 83* 83*  --   CO2 31 30  --  31 31 29   --   GLUCOSE 94 89  --  100* 113* 101*  --   BUN 10 9  --  8 8 8   --   CREATININE 0.93 0.85  --  0.94 0.96 0.88  --   CALCIUM 7.6* 7.8*  --  7.9* 7.8* 7.6*  --   MG  --   --  1.6*  --   --   --  1.9   GFR: Estimated Creatinine Clearance: 92.6 mL/min (by C-G formula based on SCr of 0.88 mg/dL). Liver Function Tests:  Recent Labs Lab 11/21/15 1525 11/24/15 1146 11/25/15 0321  AST 142* 136* 123*  ALT 36 33 30  ALKPHOS 116 122* 103  BILITOT 3.0* 3.0* 2.2*  PROT 7.1 7.0 5.8*  ALBUMIN 2.3* 2.3* 1.9*   No results for input(s): LIPASE, AMYLASE in the last 168 hours.  Recent Labs Lab 11/24/15 1434  AMMONIA 22   Coagulation Profile:  Recent Labs Lab 11/24/15 1146 11/25/15 0321 11/26/15 0309  INR 1.6* 1.56 1.63   Cardiac Enzymes: No results for input(s): CKTOTAL, CKMB, CKMBINDEX, TROPONINI in the last 168 hours. BNP (last 3 results) No results for input(s): PROBNP in the last 8760 hours. HbA1C: No results for input(s): HGBA1C in the last 72 hours. CBG: No results for input(s): GLUCAP in the last 168 hours. Lipid Profile: No results for input(s): CHOL, HDL, LDLCALC, TRIG, CHOLHDL, LDLDIRECT in the last 72 hours. Thyroid Function Tests:  Recent Labs  11/24/15 1742  TSH 3.707   Anemia Panel:  Recent Labs  11/25/15 0959 11/26/15 0309  FERRITIN 33  --   TIBC 216*  --   IRON 15*  --   RETICCTPCT  --  4.7*   Urine analysis: No results found for: COLORURINE, APPEARANCEUR, LABSPEC, PHURINE, GLUCOSEU, HGBUR, BILIRUBINUR, KETONESUR, PROTEINUR, UROBILINOGEN, NITRITE, LEUKOCYTESUR Sepsis  Labs: @LABRCNTIP (procalcitonin:4,lacticidven:4) ) Recent Results (from the past 240 hour(s))  MRSA PCR Screening     Status: None   Collection Time: 11/24/15  1:50 PM  Result Value Ref Range Status   MRSA by PCR NEGATIVE NEGATIVE Final    Comment:  The GeneXpert MRSA Assay (FDA approved for NASAL specimens only), is one component of a comprehensive MRSA colonization surveillance program. It is not intended to diagnose MRSA infection nor to guide or monitor treatment for MRSA infections.   Culture, body fluid-bottle     Status: None (Preliminary result)   Collection Time: 11/24/15  3:10 PM  Result Value Ref Range Status   Specimen Description FLUID PERITONEAL  Final   Special Requests BOTTLES DRAWN AEROBIC ONLY 10CC  Final   Culture   Final    NO GROWTH < 24 HOURS Performed at [facility]    Report Status PENDING  Incomplete  Gram stain     Status: None   Collection Time: 11/24/15  3:10 PM  Result Value Ref Range Status   Specimen Description FLUID PERITONEAL  Final   Special Requests NONE  Final   Gram Stain   Final    CYTOSPIN SMEAR WBC PRESENT, PREDOMINANTLY MONONUCLEAR NO ORGANISMS SEEN Performed at [facility]    Report Status 11/24/2015 FINAL  Final         Radiology Studies: US Paracentesis  Result Date: 11/25/2015 INDICATION: Alcoholic cirrhosis, ascites; request made for diagnostic and therapeutic paracentesis. EXAM: ULTRASOUND GUIDED DIAGNOSTIC AND THERAPEUTIC PARACENTESIS MEDICATIONS: None. COMPLICATIONS: None immediate. PROCEDURE: Informed written consent was obtained from the patient after a discussion of the risks, benefits and alternatives to treatment. A timeout was performed prior to the initiation of the procedure. Initial ultrasound scanning demonstrates a large amount of ascites within the right mid to lower abdominal quadrant. The right mid to lower abdomen was prepped and draped in the usual sterile fashion. 1% lidocaine was  used for local anesthesia. Following this, a Yueh catheter was introduced. An ultrasound image was saved for documentation purposes. The paracentesis was performed. The catheter was removed and a dressing was applied. The patient tolerated the procedure well without immediate post procedural complication. FINDINGS: A total of approximately 4 liters of clear, yellow fluid was removed. A portion of the fluid was submitted to the lab for preordered studies. Since this was the patient's initial paracentesis only the above amount of fluid was removed at this time. IMPRESSION: Successful ultrasound-guided diagnostic and therapeutic paracentesis yielding 4 liters of peritoneal fluid. Read by: Rowe Robert, PA-C Electronically Signed   By: Marybelle Killings M.D.   On: 11/24/2015 15:34      Scheduled Meds: . sodium chloride   Intravenous Once  . furosemide  40 mg Intravenous Daily  . nicotine  14 mg Transdermal Daily  . pantoprazole  40 mg Oral BID  . sodium chloride flush  3 mL Intracatheter Q12H  . spironolactone  50 mg Oral BID   Continuous Infusions:    LOS: 2 days    Time spent in minutes: 21    [place], MD [facility] Pager: [email or web address] Password [facility] 11/26/2015, 10:24 AM

## 2015-11-27 LAB — PROTIME-INR
INR: 1.53
Prothrombin Time: 18.5 seconds — ABNORMAL HIGH (ref 11.4–15.2)

## 2015-11-27 LAB — CBC
HEMATOCRIT: 26 % — AB (ref 39.0–52.0)
HEMOGLOBIN: 9.3 g/dL — AB (ref 13.0–17.0)
MCH: 31.3 pg (ref 26.0–34.0)
MCHC: 35.8 g/dL (ref 30.0–36.0)
MCV: 87.5 fL (ref 78.0–100.0)
Platelets: 291 10*3/uL (ref 150–400)
RBC: 2.97 MIL/uL — ABNORMAL LOW (ref 4.22–5.81)
RDW: 16.4 % — AB (ref 11.5–15.5)
WBC: 16.9 10*3/uL — AB (ref 4.0–10.5)

## 2015-11-27 LAB — COMPREHENSIVE METABOLIC PANEL
ALK PHOS: 103 U/L (ref 38–126)
ALT: 30 U/L (ref 17–63)
AST: 109 U/L — AB (ref 15–41)
Albumin: 1.9 g/dL — ABNORMAL LOW (ref 3.5–5.0)
Anion gap: 6 (ref 5–15)
BILIRUBIN TOTAL: 3.7 mg/dL — AB (ref 0.3–1.2)
BUN: 7 mg/dL (ref 6–20)
CALCIUM: 7.6 mg/dL — AB (ref 8.9–10.3)
CO2: 30 mmol/L (ref 22–32)
Chloride: 83 mmol/L — ABNORMAL LOW (ref 101–111)
Creatinine, Ser: 0.82 mg/dL (ref 0.61–1.24)
GFR calc Af Amer: >60 mL/min (ref >60)
GLUCOSE: 96 mg/dL (ref 65–99)
POTASSIUM: 3.1 mmol/L — AB (ref 3.5–5.1)
Sodium: 119 mmol/L — CL (ref 135–145)
TOTAL PROTEIN: 6.2 g/dL — AB (ref 6.5–8.1)

## 2015-11-27 LAB — BASIC METABOLIC PANEL
Anion gap: 7 (ref 5–15)
BUN: 6 mg/dL (ref 6–20)
CALCIUM: 7.8 mg/dL — AB (ref 8.9–10.3)
CO2: 31 mmol/L (ref 22–32)
CREATININE: 0.67 mg/dL (ref 0.61–1.24)
Chloride: 84 mmol/L — ABNORMAL LOW (ref 101–111)
GFR calc Af Amer: >60 mL/min (ref >60)
GLUCOSE: 87 mg/dL (ref 65–99)
POTASSIUM: 3.5 mmol/L (ref 3.5–5.1)
SODIUM: 122 mmol/L — AB (ref 135–145)

## 2015-11-27 LAB — MITOCHONDRIAL ANTIBODIES: MITOCHONDRIAL M2 AB, IGG: 15.6 U (ref 0.0–20.0)

## 2015-11-27 LAB — ANTI-SMOOTH MUSCLE ANTIBODY, IGG: F-ACTIN AB IGG: 36 U — AB (ref 0–19)

## 2015-11-27 MED ORDER — ACETAMINOPHEN 325 MG PO TABS: 650.0000 mg | ORAL_TABLET | ORAL | Status: DC | PRN

## 2015-11-27 MED ORDER — POTASSIUM CHLORIDE CRYS ER 20 MEQ PO TBCR: 40.0000 meq | EXTENDED_RELEASE_TABLET | ORAL | Status: DC

## 2015-11-27 MED ORDER — POTASSIUM CHLORIDE CRYS ER 20 MEQ PO TBCR
40.0000 meq | EXTENDED_RELEASE_TABLET | Freq: Once | ORAL | Status: AC
  Administered 2015-11-27: 40 meq via ORAL
  Filled 2015-11-27: qty 2

## 2015-11-27 MED ORDER — SPIRONOLACTONE 25 MG PO TABS: 50.0000 mg | ORAL_TABLET | Freq: Every day | ORAL | Status: DC

## 2015-11-27 MED ORDER — ACETAMINOPHEN 325 MG PO TABS
650.0000 mg | ORAL_TABLET | Freq: Three times a day (TID) | ORAL | Status: DC | PRN
  Administered 2015-11-27: 650 mg via ORAL
  Filled 2015-11-27: qty 2

## 2015-11-27 MED ORDER — SODIUM CHLORIDE 0.9 % IV SOLN
INTRAVENOUS | Status: DC
  Administered 2015-11-27: 06:00:00 via INTRAVENOUS

## 2015-11-27 MED ORDER — SODIUM CHLORIDE 1 G PO TABS
1.0000 g | ORAL_TABLET | Freq: Three times a day (TID) | ORAL | Status: DC
  Administered 2015-11-27 – 2015-11-28 (×3): 1 g via ORAL
  Filled 2015-11-27 (×3): qty 1

## 2015-11-27 NOTE — Progress Notes (Signed)
CRITICAL VALUE ALERT  Critical value received:  NA 119  Date of notification:  11/27/2015   Time of notification:  5:21 AM   Critical value read back:Yes.    Nurse who received alert:  Jeanie Sewer   MD notified (1st page):  Dr Maudie Mercury  Time of first page:  5:21 AM   MD notified (2nd page):  Time of second page:  Responding MD:  DR Maudie Mercury  Time MD responded:  5:21 AM

## 2015-11-27 NOTE — Progress Notes (Addendum)
PROGRESS NOTE    Todd Higgins  [id number] DOB: 11-Oct-1970 DOA: 11/24/2015  PCP: Viviana Simpler, MD   Brief Narrative:  45 y/o male with h/o alcohol abuse who quit drinking in 11/07/15, resultant cirrhosis with ascites who was recently started on diuretics. Despite diuretics, he is gaining fluid in his abdomen and legs.  He presents to the hospital from the GI office for weakness and is found to have a sodium of 118. Previous sodium 123.  Subjective: Mild lower abdominal pain this AM. Has resolved. Having normal BMs. No nausea or vomiting. No cough, congestion, chest pain, nausea, vomiting, diarrhea or dysuria.   Assessment & Plan:   Principal Problem:  Hyponatremia  / hypokalemia -due to fluid overload- sodium has improved to baseline with diuretics- as far as increasing this to normal, have discussed with renal who states that it is unlikely to return to a normal level at this time- will try sodium tabs today - hypokalemia replaced  Decompensated hepatic cirrhosis (likely alcoholic) with ascites and pedal edema - s/p paracentesis of 4 L - watching for re accumulation - liver serologies and AFP ordered by GI- Ig A significantly elevated, AFP normal  Leukocytosis/ low grade temps (99) - follow- no SBP noted- no other signs of infection  Mild hypotension - follow with diuresis  Ascites and abdominal pain - ascitic fluid negative for SBP- d/c Rocephin  Hypokalemia and hypomagnesemia - replacing   GI bleed  - c/o blood streaked stools- ? Hemorrhoidal - no recurrence in the hospital thus far - GI following- no need to do endoscopic procedures at this time pr GI  Anemia- of chronic disease, Iron deficiency and possible chronic blood loss  - anemia panel reveals iron deficiency-retic count mildly elevated -  given 1 u PRBCor Hb of 7.4 with rise to 9.3 -  will start oral Iron- watch for constipation  Coagulopathy - likely from cirrhosis- Vit K given- no improvement in  INR    Alcohol use disorder -admits to have quit drinking  Constipation - Lactulose given with good results- cont Miralax daily PRN-  follow  Nicotine abuse - cessation recommended- Nicoderm patch   DVT prophylaxis: SCDs Code Status: Full code Family Communication:  Disposition Plan: home in 2-3 days once diuresed  Consultants:   GI Procedures:   Paracentesis 8/24 Antimicrobials:  Anti-infectives    Start     Dose/Rate Route Frequency Ordered Stop   11/24/15 1430  cefTRIAXone (ROCEPHIN) 2 g in dextrose 5 % 50 mL IVPB  Status:  Discontinued     2 g 100 mL/hr over 30 Minutes Intravenous Daily 11/24/15 1359 11/25/15 0813       Objective: Vitals:   11/26/15 1915 11/26/15 2319 11/27/15 0432 11/27/15 0500  BP: 111/79     Pulse:      Resp: 14     Temp: 98.6 F (37 C) 98.7 F (37.1 C) 98.7 F (37.1 C)   TempSrc: Oral Oral Axillary   SpO2: 96%     Weight:    67.9 kg (149 lb 11.1 oz)  Height:        Intake/Output Summary (Last 24 hours) at 11/27/15 1031 Last data filed at 11/27/15 0539  Gross per 24 hour  Intake              955 ml  Output              100 ml  Net  855 ml   Filed Weights   11/25/15 0400 11/26/15 0500 11/27/15 0500  Weight: 62.5 kg (137 lb 12.6 oz) 63.1 kg (139 lb 1.8 oz) 67.9 kg (149 lb 11.1 oz)    Examination: General exam: Appears comfortable  HEENT: PERRLA, oral mucosa moist, no sclera icterus or thrush Respiratory system: Clear to auscultation. Respiratory effort normal. Cardiovascular system: S1 & S2 heard, RRR.  No murmurs  Gastrointestinal system: Abdomen soft, non-tender,  Distended, tympanic to precussion, Normal bowel sound. No organomegaly Central nervous system: Alert and oriented. No focal neurological deficits. Extremities: No cyanosis, clubbing - 2 + pitting edema has resolved  Skin: No rashes or ulcers Psychiatry:  Mood & affect appropriate.     Data Reviewed: I have personally reviewed following labs and  imaging studies  CBC:  Recent Labs Lab 11/21/15 1525 11/24/15 1146 11/25/15 0321 11/26/15 0309 11/26/15 2146 11/27/15 0335  WBC 15.0* 16.8* 14.9* 17.8*  --  16.9*  NEUTROABS 12.0* 13.6*  --   --   --   --   HGB 8.5 Repeated and verified X2.* 8.4 Repeated and verified X2.* 7.0* 7.4* 10.0* 9.3*  HCT 25.2 Repeated and verified X2.* 24.3 Repeated and verified X2.* 18.9* 21.2* 28.0* 26.0*  MCV 94.3 93.6 87.1 88.0  --  87.5  PLT 352.0 369.0 289 306  --  [id number]   Basic Metabolic Panel:  Recent Labs Lab 11/25/15 0334  11/25/15 1942 11/26/15 0309 11/26/15 0818 11/26/15 2146 11/27/15 0335 11/27/15 0746  NA  --   < > 121* 120*  --  121* 119* 122*  K  --   < > 2.9* 2.6*  --  2.9* 3.1* 3.5  CL  --   < > 83* 83*  --  83* 83* 84*  CO2  --   < > 31 29  --  31 30 31   GLUCOSE  --   < > 113* 101*  --  116* 96 87  BUN  --   < > 8 8  --  7 7 6   CREATININE  --   < > 0.96 0.88  --  0.93 0.82 0.67  CALCIUM  --   < > 7.8* 7.6*  --  7.7* 7.6* 7.8*  MG 1.6*  --   --   --  1.9  --   --   --   < > = values in this interval not displayed. GFR: Estimated Creatinine Clearance: 109.6 mL/min (by C-G formula based on SCr of 0.8 mg/dL). Liver Function Tests:  Recent Labs Lab 11/21/15 1525 11/24/15 1146 11/25/15 0321 11/27/15 0335  AST 142* 136* 123* 109*  ALT 36 33 30 30  ALKPHOS 116 122* 103 103  BILITOT 3.0* 3.0* 2.2* 3.7*  PROT 7.1 7.0 5.8* 6.2*  ALBUMIN 2.3* 2.3* 1.9* 1.9*   No results for input(s): LIPASE, AMYLASE in the last 168 hours.  Recent Labs Lab 11/24/15 1434  AMMONIA 22   Coagulation Profile:  Recent Labs Lab 11/24/15 1146 11/25/15 0321 11/26/15 0309 11/27/15 0335  INR 1.6* 1.56 1.63 1.53   Cardiac Enzymes: No results for input(s): CKTOTAL, CKMB, CKMBINDEX, TROPONINI in the last 168 hours. BNP (last 3 results) No results for input(s): PROBNP in the last 8760 hours. HbA1C: No results for input(s): HGBA1C in the last 72 hours. CBG: No results for input(s): GLUCAP  in the last 168 hours. Lipid Profile: No results for input(s): CHOL, HDL, LDLCALC, TRIG, CHOLHDL, LDLDIRECT in the last 72 hours. Thyroid Function Tests:  Recent Labs  11/24/15 1742  TSH 3.707   Anemia Panel:  Recent Labs  11/25/15 0959 11/26/15 0309  VITAMINB12  --  1,413*  FOLATE  --  14.1  FERRITIN 33 27  TIBC 216* 217*  IRON 15* 27*  RETICCTPCT  --  4.7*   Urine analysis: No results found for: COLORURINE, APPEARANCEUR, LABSPEC, PHURINE, GLUCOSEU, HGBUR, BILIRUBINUR, KETONESUR, PROTEINUR, UROBILINOGEN, NITRITE, LEUKOCYTESUR Sepsis Labs: @LABRCNTIP (procalcitonin:4,lacticidven:4) ) Recent Results (from the past 240 hour(s))  MRSA PCR Screening     Status: None   Collection Time: 11/24/15  1:50 PM  Result Value Ref Range Status   MRSA by PCR NEGATIVE NEGATIVE Final    Comment:        The GeneXpert MRSA Assay (FDA approved for NASAL specimens only), is one component of a comprehensive MRSA colonization surveillance program. It is not intended to diagnose MRSA infection nor to guide or monitor treatment for MRSA infections.   Culture, body fluid-bottle     Status: None (Preliminary result)   Collection Time: 11/24/15  3:10 PM  Result Value Ref Range Status   Specimen Description FLUID PERITONEAL  Final   Special Requests BOTTLES DRAWN AEROBIC ONLY 10CC  Final   Culture   Final    NO GROWTH 2 DAYS Performed at [facility]    Report Status PENDING  Incomplete  Gram stain     Status: None   Collection Time: 11/24/15  3:10 PM  Result Value Ref Range Status   Specimen Description FLUID PERITONEAL  Final   Special Requests NONE  Final   Gram Stain   Final    CYTOSPIN SMEAR WBC PRESENT, PREDOMINANTLY MONONUCLEAR NO ORGANISMS SEEN Performed at [facility]    Report Status 11/24/2015 FINAL  Final         Radiology Studies: No results found.    Scheduled Meds: . ferrous sulfate  325 mg Oral BID WC  . furosemide  40 mg Intravenous  Daily  . nicotine  14 mg Transdermal Daily  . pantoprazole  40 mg Oral BID  . potassium chloride  40 mEq Oral Q4H  . sodium chloride flush  3 mL Intracatheter Q12H  . spironolactone  50 mg Oral BID   Continuous Infusions:    LOS: 3 days    Time spent in minutes: 31    [place], MD [facility] Pager: [email or web address] Password [facility] 11/27/2015, 10:31 AM

## 2015-11-28 ENCOUNTER — Ambulatory Visit: Payer: BLUE CROSS/BLUE SHIELD | Admitting: Physician Assistant

## 2015-11-28 ENCOUNTER — Other Ambulatory Visit: Payer: Self-pay

## 2015-11-28 DIAGNOSIS — K745 Biliary cirrhosis, unspecified: Secondary | ICD-10-CM

## 2015-11-28 DIAGNOSIS — F1099 Alcohol use, unspecified with unspecified alcohol-induced disorder: Secondary | ICD-10-CM

## 2015-11-28 LAB — CBC WITH DIFFERENTIAL/PLATELET
BASOS ABS: 0 10*3/uL (ref 0.0–0.1)
BASOS PCT: 0 %
EOS PCT: 2 %
Eosinophils Absolute: 0.2 10*3/uL (ref 0.0–0.7)
HEMATOCRIT: 27.7 % — AB (ref 39.0–52.0)
Hemoglobin: 9.9 g/dL — ABNORMAL LOW (ref 13.0–17.0)
Lymphocytes Relative: 21 %
Lymphs Abs: 2.9 10*3/uL (ref 0.7–4.0)
MCH: 31.3 pg (ref 26.0–34.0)
MCHC: 35.7 g/dL (ref 30.0–36.0)
MCV: 87.7 fL (ref 78.0–100.0)
MONO ABS: 1.3 10*3/uL — AB (ref 0.1–1.0)
MONOS PCT: 9 %
Neutro Abs: 9.3 10*3/uL — ABNORMAL HIGH (ref 1.7–7.7)
Neutrophils Relative %: 68 %
PLATELETS: 293 10*3/uL (ref 150–400)
RBC: 3.16 MIL/uL — ABNORMAL LOW (ref 4.22–5.81)
RDW: 16.5 % — AB (ref 11.5–15.5)
WBC: 13.6 10*3/uL — ABNORMAL HIGH (ref 4.0–10.5)

## 2015-11-28 LAB — TYPE AND SCREEN
ABO/RH(D): O POS
ANTIBODY SCREEN: NEGATIVE
UNIT DIVISION: 0
Unit division: 0

## 2015-11-28 LAB — BASIC METABOLIC PANEL
Anion gap: 7 (ref 5–15)
BUN: 9 mg/dL (ref 6–20)
CALCIUM: 7.9 mg/dL — AB (ref 8.9–10.3)
CO2: 30 mmol/L (ref 22–32)
CREATININE: 0.71 mg/dL (ref 0.61–1.24)
Chloride: 84 mmol/L — ABNORMAL LOW (ref 101–111)
GFR calc Af Amer: >60 mL/min (ref >60)
GLUCOSE: 97 mg/dL (ref 65–99)
Potassium: 3 mmol/L — ABNORMAL LOW (ref 3.5–5.1)
Sodium: 121 mmol/L — ABNORMAL LOW (ref 135–145)

## 2015-11-28 LAB — ANTINUCLEAR ANTIBODIES, IFA: ANA Ab, IFA: NEGATIVE

## 2015-11-28 MED ORDER — LACTULOSE 10 GM/15ML PO SOLN: 20.0000 g | Freq: Every day | ORAL | Status: DC | PRN

## 2015-11-28 MED ORDER — FUROSEMIDE 40 MG PO TABS: 20.0000 mg | ORAL_TABLET | Freq: Every day | ORAL | 0 refills | Status: DC

## 2015-11-28 MED ORDER — FERROUS SULFATE 325 (65 FE) MG PO TABS: 325.0000 mg | ORAL_TABLET | Freq: Two times a day (BID) | ORAL | 3 refills | Status: DC

## 2015-11-28 MED ORDER — MENTHOL 3 MG MT LOZG
1.0000 | LOZENGE | OROMUCOSAL | Status: DC | PRN
  Filled 2015-11-28: qty 9

## 2015-11-28 MED ORDER — LACTULOSE 10 GM/15ML PO SOLN: 20.0000 g | Freq: Every day | ORAL | 0 refills | Status: DC | PRN

## 2015-11-28 MED ORDER — POTASSIUM CHLORIDE CRYS ER 20 MEQ PO TBCR
40.0000 meq | EXTENDED_RELEASE_TABLET | ORAL | Status: AC
  Administered 2015-11-28 (×2): 40 meq via ORAL
  Filled 2015-11-28 (×2): qty 2

## 2015-11-28 MED ORDER — SPIRONOLACTONE 25 MG PO TABS
50.0000 mg | ORAL_TABLET | Freq: Every day | ORAL | Status: DC
  Administered 2015-11-28: 50 mg via ORAL
  Filled 2015-11-28: qty 2

## 2015-11-28 MED ORDER — FUROSEMIDE 20 MG PO TABS
20.0000 mg | ORAL_TABLET | Freq: Every day | ORAL | Status: DC
  Administered 2015-11-28: 20 mg via ORAL
  Filled 2015-11-28: qty 1

## 2015-11-28 NOTE — Progress Notes (Signed)
Pt discharged from the unit via wheelchair. Discharge instructions were reviewed with the pt and family member. No questions or concerns at this time.   Loyce Flaming W Cadarius Nevares, RN 

## 2015-11-28 NOTE — Discharge Summary (Signed)
Physician Discharge Summary  Todd Higgins [id number] DOB: 06-25-1970 DOA: 11/24/2015  PCP: Viviana Simpler, MD  Admit date: 11/24/2015 Discharge date: 11/28/2015  Admitted From: home Disposition:  home   Recommendations for Outpatient Follow-up:  1. Close F/u of electrolytes including Magnesium which was noted to be low  Home Health:  none  Equipment/Devices:  none    Discharge Condition:  stable   CODE STATUS:  Full code   Diet recommendation:  Regular diet Consultations:  GI    Discharge Diagnoses:  Principal Problem:   Hyponatremia Active Problems:   Alcohol use disorder (HCC)   Malnutrition of mild degree (HCC)   Decompensated hepatic cirrhosis (HCC)   Coagulopathy (HCC)   Anemia   Hypotension   Anasarca   Constipation   Ascites   Hematochezia    Subjective: No complaints of abdominal pain, nausea or vomiting. Is having stools which are normal.   Brief Summary: 45 y/o male with h/o alcohol abuse who quit drinking in 11/07/15, resultant cirrhosis with ascites who was recently started on diuretics. Despite diuretics, he is gaining fluid in his abdomen and legs.  He presents to the hospital from the GI office for weakness and is found to have a sodium of 118. Previous sodium 123.  Hospital Course:  Principal Problem:  Hyponatremia  -due to fluid overload- however has hyponatremia at baseline due to cirrhosis - sodium has improved to baseline (120-123) with diuretics - I have discussed with renal attending who states that no further intervention will likely allow sodium to improve further than it already has   - will need close follow up of electrolytes as outpt as over diruesis or under diuresis could result worsening hyponatremia  Decompensated hepatic cirrhosis (likely alcoholic) with ascites and pedal edema - s/p paracentesis of 4 L - have been watching for re accumulation which has not occured - liver serologies and AFP ordered by GI- Ig A significantly  elevated, AFP normal- GI will follow as outpt -will be discharged with Aldactone 50 mg daily and Lasix 20 mg daily - with paracentesis and diuresis, weight has improved from 68 kg to 64 kg  Hypokalemia/ hypomagnesemia - replaced  Leukocytosis/ low grade temps (99) - follow- no SBP noted- no other signs of infection  Mild hypotension - follow with diuresis  Ascites and abdominal pain - ascitic fluid negative for SBP- d/c Rocephin  GI bleed  - c/o blood streaked stools- ? Hemorrhoidal - no recurrence in the hospital thus far - GI following- no need to do endoscopic procedures at this time pr GI- they will f/u as outpt and plan for further procedures  Anemia- of chronic disease, Iron deficiency and possible chronic blood loss  - anemia panel reveals iron deficiency-retic count mildly elevated -  given 1 u PRBCor Hb of 7.4 with rise to 9.3 -  started on oral Iron- Lactulose for constipation  Coagulopathy - likely from cirrhosis- Vit K given- no improvement in INR    Alcohol use disorder -admits to have quit drinking  Constipation - Lactulose given with good results- cont Lactulose daily PRN  Nicotine abuse - cessation recommended- Nicoderm patch  Discharge Instructions  Discharge Instructions    Discharge instructions    Complete by:  As directed   Regular diet   Increase activity slowly    Complete by:  As directed       Medication List    TAKE these medications   buPROPion 300 MG 24 hr tablet Commonly known  as:  WELLBUTRIN XL Take 1 tablet (300 mg total) by mouth daily.   feeding supplement Liqd Take 1 Container by mouth every morning.   ferrous sulfate 325 (65 FE) MG tablet Take 1 tablet (325 mg total) by mouth 2 (two) times daily with a meal.   furosemide 40 MG tablet Commonly known as:  LASIX Take 0.5 tablets (20 mg total) by mouth daily. For morning swelling in legs and abdomen What changed:  how much to take  when to take this  reasons to  take this   lactulose 10 GM/15ML solution Commonly known as:  CHRONULAC Take 30 mLs (20 g total) by mouth daily as needed for mild constipation.   multivitamin with minerals Tabs tablet Take 1 tablet by mouth daily.   omeprazole 20 MG capsule Commonly known as:  PRILOSEC Take 20 mg by mouth daily as needed (acid reflux/).   spironolactone 50 MG tablet Commonly known as:  ALDACTONE Take 1 tablet (50 mg total) by mouth daily.      Follow-up Information    Viviana Simpler, MD Follow up in 1 week(s).   Specialties:  Internal Medicine, Pediatrics Contact information: [place] [address] [phone]        [facility]. Go on 12/06/2015.   Specialty:  Gastroenterology Why:  Please proceed to the basement floor for labs including CBC and CMP so we can have these results at the time of your appt on 12/07/15 in order to adjust your medications appropriately Contact information: [place] [phone] [place], [place]. Go on 12/07/2015.   Specialty:  Gastroenterology Why:  Please arrive at 9:15 for your 9:30 appt with Ellouise Newer, PA-C Contact information: [address] [place] [address] [phone]          No Known Allergies   Procedures/Studies: US guided paracentesis  US Abdomen Complete  Result Date: 11/16/2015 CLINICAL DATA:  History of cirrhosis, possible slight T EXAM: ABDOMEN ULTRASOUND COMPLETE COMPARISON:  None. FINDINGS: Gallbladder: The gallbladder is visualized but it is not well distended. The gallbladder does appear to be slightly thick walled and there is ring down artifact consistent with gallbladder adenomyomatosis. There is no pain over the gallbladder and the gallbladder thickening may be due to adenomyomatosis and possibly hypoproteinemia. Correlate clinically. Common bile duct: Diameter: The common bile duct is normal measuring 3.5 mm.  Liver: The liver is diffusely echogenic and difficult to penetrate with nodular contours consistent with changes of cirrhosis. No focal hepatic abnormality is seen. IVC: No abnormality visualized. Pancreas: The pancreas is not well seen due to overlying bowel gas. Spleen: The spleen measures 6.9 cm. Right Kidney: Length: 11.1 cm.  No hydronephrosis is seen. Left Kidney: Length: 10.4 cm.  No hydronephrosis is noted. Abdominal aorta: The abdominal aorta is not well seen due to bowel gas. Other findings: There is ascites noted throughout the peritoneal cavity. IMPRESSION: 1. Ascites throughout the peritoneal cavity. 2. Irregular contours of the gallbladder with thickened wall most likely due to gallbladder adenomyomatosis and possibly hypoproteinemia. There is no pain over the gallbladder. 3. Changes of cirrhosis with echogenic nodular liver. No focal hepatic abnormality is seen. 4. The pancreas is not well visualized. Electronically Signed   By: Ivar Drape M.D.   On: 11/16/2015 10:13   US Paracentesis  Result Date: 11/25/2015 INDICATION: Alcoholic cirrhosis, ascites; request made for diagnostic and therapeutic paracentesis. EXAM:  ULTRASOUND GUIDED DIAGNOSTIC AND THERAPEUTIC PARACENTESIS MEDICATIONS: None. COMPLICATIONS: None immediate. PROCEDURE: Informed written consent was obtained from the patient after a discussion of the risks, benefits and alternatives to treatment. A timeout was performed prior to the initiation of the procedure. Initial ultrasound scanning demonstrates a large amount of ascites within the right mid to lower abdominal quadrant. The right mid to lower abdomen was prepped and draped in the usual sterile fashion. 1% lidocaine was used for local anesthesia. Following this, a Yueh catheter was introduced. An ultrasound image was saved for documentation purposes. The paracentesis was performed. The catheter was removed and a dressing was applied. The patient tolerated the procedure well without  immediate post procedural complication. FINDINGS: A total of approximately 4 liters of clear, yellow fluid was removed. A portion of the fluid was submitted to the lab for preordered studies. Since this was the patient's initial paracentesis only the above amount of fluid was removed at this time. IMPRESSION: Successful ultrasound-guided diagnostic and therapeutic paracentesis yielding 4 liters of peritoneal fluid. Read by: Rowe Robert, PA-C Electronically Signed   By: Marybelle Killings M.D.   On: 11/24/2015 15:34       Discharge Exam: Vitals:   11/27/15 2126 11/28/15 0500  BP: 113/78 108/71  Pulse: 97 86  Resp: 15 17  Temp: 98.5 F (36.9 C) 97.7 F (36.5 C)   Vitals:   11/27/15 1000 11/27/15 2126 11/28/15 0500 11/28/15 0600  BP: 113/75 113/78 108/71   Pulse:  97 86   Resp: 14 15 17    Temp:  98.5 F (36.9 C) 97.7 F (36.5 C)   TempSrc:  Oral Oral   SpO2: 98% 97% 97%   Weight:    64.6 kg (142 lb 8 oz)  Height:        General: Pt is alert, awake, not in acute distress Cardiovascular: RRR, S1/S2 +, no rubs, no gallops Respiratory: CTA bilaterally, no wheezing, no rhonchi Abdominal: Soft, NT, ND, bowel sounds + Extremities: no edema, no cyanosis    The results of significant diagnostics from this hospitalization (including imaging, microbiology, ancillary and laboratory) are listed below for reference.     Microbiology: Recent Results (from the past 240 hour(s))  MRSA PCR Screening     Status: None   Collection Time: 11/24/15  1:50 PM  Result Value Ref Range Status   MRSA by PCR NEGATIVE NEGATIVE Final    Comment:        The GeneXpert MRSA Assay (FDA approved for NASAL specimens only), is one component of a comprehensive MRSA colonization surveillance program. It is not intended to diagnose MRSA infection nor to guide or monitor treatment for MRSA infections.   Culture, body fluid-bottle     Status: None (Preliminary result)   Collection Time: 11/24/15  3:10 PM   Result Value Ref Range Status   Specimen Description FLUID PERITONEAL  Final   Special Requests BOTTLES DRAWN AEROBIC ONLY 10CC  Final   Culture   Final    NO GROWTH 3 DAYS Performed at [facility]    Report Status PENDING  Incomplete  Gram stain     Status: None   Collection Time: 11/24/15  3:10 PM  Result Value Ref Range Status   Specimen Description FLUID PERITONEAL  Final   Special Requests NONE  Final   Gram Stain   Final    CYTOSPIN SMEAR WBC PRESENT, PREDOMINANTLY MONONUCLEAR NO ORGANISMS SEEN Performed at [facility]    Report Status 11/24/2015  FINAL  Final     Labs: BNP (last 3 results) No results for input(s): BNP in the last 8760 hours. Basic Metabolic Panel:  Recent Labs Lab 11/25/15 0334  11/26/15 0309 11/26/15 0818 11/26/15 2146 11/27/15 0335 11/27/15 0746 11/28/15 0532  NA  --   < > 120*  --  121* 119* 122* 121*  K  --   < > 2.6*  --  2.9* 3.1* 3.5 3.0*  CL  --   < > 83*  --  83* 83* 84* 84*  CO2  --   < > 29  --  31 30 31 30   GLUCOSE  --   < > 101*  --  116* 96 87 97  BUN  --   < > 8  --  7 7 6 9   CREATININE  --   < > 0.88  --  0.93 0.82 0.67 0.71  CALCIUM  --   < > 7.6*  --  7.7* 7.6* 7.8* 7.9*  MG 1.6*  --   --  1.9  --   --   --   --   < > = values in this interval not displayed. Liver Function Tests:  Recent Labs Lab 11/21/15 1525 11/24/15 1146 11/25/15 0321 11/27/15 0335  AST 142* 136* 123* 109*  ALT 36 33 30 30  ALKPHOS 116 122* 103 103  BILITOT 3.0* 3.0* 2.2* 3.7*  PROT 7.1 7.0 5.8* 6.2*  ALBUMIN 2.3* 2.3* 1.9* 1.9*   No results for input(s): LIPASE, AMYLASE in the last 168 hours.  Recent Labs Lab 11/24/15 1434  AMMONIA 22   CBC:  Recent Labs Lab 11/21/15 1525 11/24/15 1146 11/25/15 0321 11/26/15 0309 11/26/15 2146 11/27/15 0335 11/28/15 0532  WBC 15.0* 16.8* 14.9* 17.8*  --  16.9* 13.6*  NEUTROABS 12.0* 13.6*  --   --   --   --  9.3*  HGB 8.5 Repeated and verified X2.* 8.4 Repeated and verified  X2.* 7.0* 7.4* 10.0* 9.3* 9.9*  HCT 25.2 Repeated and verified X2.* 24.3 Repeated and verified X2.* 18.9* 21.2* 28.0* 26.0* 27.7*  MCV 94.3 93.6 87.1 88.0  --  87.5 87.7  PLT 352.0 369.0 289 306  --  291 293   Cardiac Enzymes: No results for input(s): CKTOTAL, CKMB, CKMBINDEX, TROPONINI in the last 168 hours. BNP: Invalid input(s): POCBNP CBG: No results for input(s): GLUCAP in the last 168 hours. D-Dimer No results for input(s): DDIMER in the last 72 hours. Hgb A1c No results for input(s): HGBA1C in the last 72 hours. Lipid Profile No results for input(s): CHOL, HDL, LDLCALC, TRIG, CHOLHDL, LDLDIRECT in the last 72 hours. Thyroid function studies No results for input(s): TSH, T4TOTAL, T3FREE, THYROIDAB in the last 72 hours.  Invalid input(s): FREET3 Anemia work up  Recent Labs  11/26/15 0309  VITAMINB12 1,413*  FOLATE 14.1  FERRITIN 27  TIBC 217*  IRON 27*  RETICCTPCT 4.7*   Urinalysis No results found for: COLORURINE, APPEARANCEUR, [place], [place], GLUCOSEU, HGBUR, BILIRUBINUR, KETONESUR, PROTEINUR, UROBILINOGEN, NITRITE, LEUKOCYTESUR Sepsis Labs Invalid input(s): PROCALCITONIN,  WBC,  LACTICIDVEN Microbiology Recent Results (from the past 240 hour(s))  MRSA PCR Screening     Status: None   Collection Time: 11/24/15  1:50 PM  Result Value Ref Range Status   MRSA by PCR NEGATIVE NEGATIVE Final    Comment:        The GeneXpert MRSA Assay (FDA approved for NASAL specimens only), is one component of a comprehensive MRSA colonization surveillance  program. It is not intended to diagnose MRSA infection nor to guide or monitor treatment for MRSA infections.   Culture, body fluid-bottle     Status: None (Preliminary result)   Collection Time: 11/24/15  3:10 PM  Result Value Ref Range Status   Specimen Description FLUID PERITONEAL  Final   Special Requests BOTTLES DRAWN AEROBIC ONLY 10CC  Final   Culture   Final    NO GROWTH 3 DAYS Performed at [facility]    Report Status PENDING  Incomplete  Gram stain     Status: None   Collection Time: 11/24/15  3:10 PM  Result Value Ref Range Status   Specimen Description FLUID PERITONEAL  Final   Special Requests NONE  Final   Gram Stain   Final    CYTOSPIN SMEAR WBC PRESENT, PREDOMINANTLY MONONUCLEAR NO ORGANISMS SEEN Performed at [facility]    Report Status 11/24/2015 FINAL  Final     Time coordinating discharge: Over 30 minutes  SIGNED:   Debbe Odea, MD  [facility] 11/28/2015, 1:18 PM Pager   If 7PM-7AM, please contact night-coverage [email or web address] Password [email or web address]

## 2015-11-28 NOTE — Progress Notes (Addendum)
Progress Note   Subjective  Chief Complaint:cirrhosis with ascites, generalized abdominal pain, anemia, hematochezia, constipation,  Decreased electrolytes, coagulopathy, leukocytosis, hyperbilirubinemia and alcoholism  Today, the patient is found laying in bed, he has just had a bowel movement which he tells me continues to be "very small", and hard to pass. He explains that he was on lactulose initially which was giving him some incontinence. He blames this slightly on the nursing staff being slow and not assisting him to the restroom. Otherwise patient tolerating diet well and aware that he will need close follow-up at time of discharge.   Objective   Vital signs in last 24 hours: Temp:  [97.7 F (36.5 C)-98.5 F (36.9 C)] 97.7 F (36.5 C) (08/28 0500) Pulse Rate:  [86-97] 86 (08/28 0500) Resp:  [15-17] 17 (08/28 0500) BP: (108-113)/(71-78) 108/71 (08/28 0500) SpO2:  [97 %] 97 % (08/28 0500) Weight:  [142 lb 8 oz (64.6 kg)] 142 lb 8 oz (64.6 kg) (08/28 0600) Last BM Date: 11/28/15 General:    Caucasian male in NAD Heart:  Regular rate and rhythm; no murmurs Lungs: Respirations even and unlabored, lungs CTA bilaterally Abdomen:  Soft, nontender and mild distension. Normal bowel sounds. Extremities:  Without edema. Neurologic:  Alert and oriented,  grossly normal neurologically. Psych:  Cooperative. Normal mood and affect.  Intake/Output from previous day: 08/27 0701 - 08/28 0700 In: 1440 [P.O.:1440] Out: [id number] [Urine:1275]  Lab Results:  Recent Labs  11/26/15 0309 11/26/15 2146 11/27/15 0335 11/28/15 0532  WBC 17.8*  --  16.9* 13.6*  HGB 7.4* 10.0* 9.3* 9.9*  HCT 21.2* 28.0* 26.0* 27.7*  PLT 306  --  291 293   BMET  Recent Labs  11/27/15 0335 11/27/15 0746 11/28/15 0532  NA 119* 122* 121*  K 3.1* 3.5 3.0*  CL 83* 84* 84*  CO2 30 31 30   GLUCOSE 96 87 97  BUN 7 6 9   CREATININE 0.82 0.67 0.71  CALCIUM 7.6* 7.8* 7.9*   LFT  Recent Labs   11/27/15 0335  PROT 6.2*  ALBUMIN 1.9*  AST 109*  ALT 30  ALKPHOS 103  BILITOT 3.7*   PT/INR  Recent Labs  11/26/15 0309 11/27/15 0335  LABPROT 19.5* 18.5*  INR 1.63 1.53    Studies/Results: No results found.     Assessment / Plan:   Assessment: 1. Cirrhosis with ascites:Newly diagnosed via ultrasound on 11/16/2015 and fluid buildup over the past 2 weeks. This is thought to be due to alcohol abuse, patient does describe drinking at least 10 beers per day over the past 4-5 years. He "quit cold [place]" on 11/07/2015. 2. Generalized abdominal pain:Patient described severe generalized abdominal pain.  Improved post paracentesis with 4 Liters of fluid removed.  No SBP. 3. Anemia:Patient describes hematochezia over the past 2 weeks, hemoglobin trending down but no sign of active bleeding. 4. Hematochezia:Intermittent bright red blood for the past two weeks.  No bleeding since admission.  Patient thinks that he has hemorrhoids. 5. Constipation:For the past 2 weeks,chronic, now being managed with Miralax 6. Hyponatremia:Sodium remains low.   7. Hypokalemia:Also related to cirrhosis. 8. Leukocytosis:Down slightly today.  Tap negative for SBP. 9. Coagulopathy:From cirrhosis.   10. Hyperbilirubinemia:  From cirrhosis. 12. Alcoholism: 10 beers per day for 4-5 yrs, quit cold [place] on 11/07/15-now maintaining abstinence, per pt   Plan: 1. Patient should be discharged on 50 mg of Aldactone daily and 20mg  of Lasix qd 2. Patient will need to proceed to our  clinic on 12/06/15 for labs including CBC and CMP 3. Patient has a follow-up appointment in 1 week on 12/07/15  With me at 9:30 AM, at that time will discuss adjustment of diuretics and arrangement for EGD/colonoscopy 4. Okay with patient being discharged today with close follow-up as above 5. Discussed above with Dr. Silverio Decamp  Thank you for your kind consultation, please let us know if we may be of any further assistance.     LOS: 4 days   Levin Erp  11/28/2015, 10:42 AM  Pager # [phone]  Avoid sodium chloride tabs as would only lead to volume overload, and usually not helpful in cirrhotics with hyponatremia  K. Denzil Magnuson , MD [phone] Mon-Fri 8a-5p [phone] after 5p, weekends, holidays

## 2015-11-29 LAB — TISSUE TRANSGLUTAMINASE, IGA: TISSUE TRANSGLUTAMINASE AB, IGA: 3 U/mL (ref 0–3)

## 2015-11-30 ENCOUNTER — Ambulatory Visit (INDEPENDENT_AMBULATORY_CARE_PROVIDER_SITE_OTHER): Payer: BLUE CROSS/BLUE SHIELD | Admitting: Internal Medicine

## 2015-11-30 ENCOUNTER — Encounter: Payer: Self-pay | Admitting: Internal Medicine

## 2015-11-30 VITALS — BP 106/64 | HR 98 | Temp 97.9°F | Wt 145.2 lb

## 2015-11-30 DIAGNOSIS — F1099 Alcohol use, unspecified with unspecified alcohol-induced disorder: Secondary | ICD-10-CM | POA: Diagnosis not present

## 2015-11-30 DIAGNOSIS — IMO0002 Reserved for concepts with insufficient information to code with codable children: Secondary | ICD-10-CM

## 2015-11-30 DIAGNOSIS — F39 Unspecified mood [affective] disorder: Secondary | ICD-10-CM

## 2015-11-30 DIAGNOSIS — K7031 Alcoholic cirrhosis of liver with ascites: Secondary | ICD-10-CM | POA: Diagnosis not present

## 2015-11-30 LAB — ALPHA-1 ANTITRYPSIN PHENOTYPE: A-1 Antitrypsin, Ser: 197 mg/dL (ref 90–200)

## 2015-11-30 LAB — COMPREHENSIVE METABOLIC PANEL
ALBUMIN: 2.4 g/dL — AB (ref 3.5–5.2)
ALK PHOS: 109 U/L (ref 39–117)
ALT: 33 U/L (ref 0–53)
AST: 96 U/L — AB (ref 0–37)
BUN: 11 mg/dL (ref 6–23)
CO2: 30 mEq/L (ref 19–32)
CREATININE: 0.86 mg/dL (ref 0.40–1.50)
Calcium: 8.1 mg/dL — ABNORMAL LOW (ref 8.4–10.5)
Chloride: 86 mEq/L — ABNORMAL LOW (ref 96–112)
GFR: 101.09 mL/min (ref >60.00)
Glucose, Bld: 91 mg/dL (ref 70–99)
Potassium: 3.6 mEq/L (ref 3.5–5.1)
SODIUM: 122 meq/L — AB (ref 135–145)
TOTAL PROTEIN: 7.1 g/dL (ref 6.0–8.3)
Total Bilirubin: 3.1 mg/dL — ABNORMAL HIGH (ref 0.2–1.2)

## 2015-11-30 LAB — CBC WITH DIFFERENTIAL/PLATELET
Basophils Absolute: 0 10*3/uL (ref 0.0–0.1)
Basophils Relative: 0.1 % (ref 0.0–3.0)
EOS ABS: 0 10*3/uL (ref 0.0–0.7)
EOS PCT: 0.2 % (ref 0.0–5.0)
HEMATOCRIT: 31.8 % — AB (ref 39.0–52.0)
HEMOGLOBIN: 10.7 g/dL — AB (ref 13.0–17.0)
LYMPHS PCT: 9.5 % — AB (ref 12.0–46.0)
Lymphs Abs: 1.6 10*3/uL (ref 0.7–4.0)
MCHC: 33.6 g/dL (ref 30.0–36.0)
MCV: 94 fl (ref 78.0–100.0)
MONO ABS: 1.3 10*3/uL — AB (ref 0.1–1.0)
Monocytes Relative: 8.2 % (ref 3.0–12.0)
Neutro Abs: 13.4 10*3/uL — ABNORMAL HIGH (ref 1.4–7.7)
Neutrophils Relative %: 82 % — ABNORMAL HIGH (ref 43.0–77.0)
Platelets: 278 10*3/uL (ref 150.0–400.0)
RBC: 3.38 Mil/uL — AB (ref 4.22–5.81)
RDW: 16.3 % — ABNORMAL HIGH (ref 11.5–15.5)
WBC: 16.3 10*3/uL — AB (ref 4.0–10.5)

## 2015-11-30 LAB — CULTURE, BODY FLUID-BOTTLE: CULTURE: NO GROWTH

## 2015-11-30 LAB — CULTURE, BODY FLUID W GRAM STAIN -BOTTLE

## 2015-11-30 NOTE — Assessment & Plan Note (Signed)
Better since in hospital May be gaining weight again--probably will need adjustment of diuretics (increase spironolactone?) Seeing GI next week

## 2015-11-30 NOTE — Assessment & Plan Note (Signed)
Has remained abstinent Has sponsor and needs to keep up with AA

## 2015-11-30 NOTE — Assessment & Plan Note (Signed)
Ongoing Will continue the bupropion

## 2015-11-30 NOTE — Progress Notes (Signed)
Subjective:    Patient ID: Todd Higgins, male    DOB: 12/21/1970, 45 y.o.   MRN: [id number]  HPI Here with mom for hospital follow up  Had paracentesis-- 4liters--but still feels bloated Not as painful Fluid negative for infection Edema in feet is better Breathing is fine Has GI follow up next week  No inclination to eat Hasn't been back to AA since hospital--but in contact with sponsor  Mood is still depressed Lots of anxiety about health, etc Hasn't been back to work  Current Outpatient Prescriptions on File Prior to Visit  Medication Sig Dispense Refill  . buPROPion (WELLBUTRIN XL) 300 MG 24 hr tablet Take 1 tablet (300 mg total) by mouth daily. 30 tablet 2  . feeding supplement (BOOST HIGH PROTEIN) LIQD Take 1 Container by mouth every morning.    . ferrous sulfate 325 (65 FE) MG tablet Take 1 tablet (325 mg total) by mouth 2 (two) times daily with a meal. 60 tablet 3  . furosemide (LASIX) 40 MG tablet Take 0.5 tablets (20 mg total) by mouth daily. For morning swelling in legs and abdomen 30 tablet 0  . lactulose (CHRONULAC) 10 GM/15ML solution Take 30 mLs (20 g total) by mouth daily as needed for mild constipation. 240 mL 0  . Multiple Vitamin (MULTIVITAMIN WITH MINERALS) TABS tablet Take 1 tablet by mouth daily.    Marland Kitchen omeprazole (PRILOSEC) 20 MG capsule Take 20 mg by mouth daily as needed (acid reflux/).     Marland Kitchen spironolactone (ALDACTONE) 50 MG tablet Take 1 tablet (50 mg total) by mouth daily. 30 tablet 1   No current facility-administered medications on file prior to visit.     No Known Allergies  Past Medical History:  Diagnosis Date  . Alcohol use disorder ([place])   . Anxiety   . Arrhythmia   . Family history of prostate cancer   . Tobacco dependence     Past Surgical History:  Procedure Laterality Date  . APPENDECTOMY  1981  . HAND SURGERY  2013  . HERNIA REPAIR  1990    Family History  Problem Relation Age of Onset  . Hypertension Mother   .  Hypertension Father   . Prostate cancer Father   . Alcohol abuse Father   . Mental illness Father   . Heart disease Maternal Grandmother   . Stroke Maternal Grandfather   . Diabetes Maternal Grandfather   . Diabetes Paternal Grandmother   . Drug abuse Maternal Uncle   . Stroke Maternal Uncle   . Prostate cancer Maternal Uncle     Social History   Social History  . Marital status: Single    Spouse name: N/A  . Number of children: 0  . Years of education: N/A   Occupational History  . 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 Smoking status: Current Every Day Smoker    Packs/day: 1.00    Years: 25.00  . Smokeless tobacco: Never Used  . Alcohol use Yes  . Drug use: No  . Sexual activity: Not on file   Other Topics Concern  . Not on file   Social History Narrative  . No narrative on file   Review of Systems Appetite is fair-- eating okay Still not sleeping well Still constipated--discussed the lactulose    Objective:   Physical Exam  Constitutional: No distress.  Neck: No thyromegaly present.  Cardiovascular: Normal rate and normal heart  sounds.  Exam reveals no gallop.   No murmur heard. Pulmonary/Chest: Effort normal and breath sounds normal. No respiratory distress. He has no wheezes. He has no rales.  Abdominal: There is no tenderness. There is no rebound and no guarding.  Mild distention--better than before hospital   Musculoskeletal:  1+ pitting edema in feet/ankles  Lymphadenopathy:    He has no cervical adenopathy.  Psychiatric:  Mild depressed  Tearful when talking about his dad Appropriate engagement          Assessment & Plan:

## 2015-12-01 ENCOUNTER — Telehealth: Payer: Self-pay | Admitting: Physician Assistant

## 2015-12-01 LAB — PH, BODY FLUID: PH, BODY FLUID: 8

## 2015-12-01 NOTE — Telephone Encounter (Signed)
Left message to keep the upcoming appointment. Any additional labs needed can be done at the appointment.

## 2015-12-06 ENCOUNTER — Ambulatory Visit: Payer: BLUE CROSS/BLUE SHIELD | Admitting: Internal Medicine

## 2015-12-07 ENCOUNTER — Other Ambulatory Visit: Payer: Self-pay

## 2015-12-07 ENCOUNTER — Ambulatory Visit (INDEPENDENT_AMBULATORY_CARE_PROVIDER_SITE_OTHER): Payer: BLUE CROSS/BLUE SHIELD | Admitting: Physician Assistant

## 2015-12-07 ENCOUNTER — Encounter: Payer: Self-pay | Admitting: Physician Assistant

## 2015-12-07 ENCOUNTER — Other Ambulatory Visit (INDEPENDENT_AMBULATORY_CARE_PROVIDER_SITE_OTHER): Payer: BLUE CROSS/BLUE SHIELD

## 2015-12-07 VITALS — BP 110/60 | HR 88 | Ht 70.0 in | Wt 142.0 lb

## 2015-12-07 DIAGNOSIS — R188 Other ascites: Secondary | ICD-10-CM

## 2015-12-07 DIAGNOSIS — K7031 Alcoholic cirrhosis of liver with ascites: Secondary | ICD-10-CM

## 2015-12-07 LAB — COMPREHENSIVE METABOLIC PANEL
ALBUMIN: 2.3 g/dL — AB (ref 3.5–5.2)
ALT: 26 U/L (ref 0–53)
AST: 78 U/L — ABNORMAL HIGH (ref 0–37)
Alkaline Phosphatase: 118 U/L — ABNORMAL HIGH (ref 39–117)
BUN: 7 mg/dL (ref 6–23)
CALCIUM: 8.1 mg/dL — AB (ref 8.4–10.5)
CHLORIDE: 88 meq/L — AB (ref 96–112)
CO2: 29 meq/L (ref 19–32)
Creatinine, Ser: 0.87 mg/dL (ref 0.40–1.50)
GFR: 99.74 mL/min (ref >60.00)
Glucose, Bld: 99 mg/dL (ref 70–99)
Potassium: 3.2 mEq/L — ABNORMAL LOW (ref 3.5–5.1)
Sodium: 125 mEq/L — ABNORMAL LOW (ref 135–145)
Total Bilirubin: 2.9 mg/dL — ABNORMAL HIGH (ref 0.2–1.2)
Total Protein: 7.1 g/dL (ref 6.0–8.3)

## 2015-12-07 LAB — PROTIME-INR
INR: 1.6 ratio — AB (ref 0.8–1.0)
PROTHROMBIN TIME: 16.7 s — AB (ref 9.6–13.1)

## 2015-12-07 LAB — CBC WITH DIFFERENTIAL/PLATELET
BASOS PCT: 0.4 % (ref 0.0–3.0)
Basophils Absolute: 0 10*3/uL (ref 0.0–0.1)
EOS PCT: 0.2 % (ref 0.0–5.0)
Eosinophils Absolute: 0 10*3/uL (ref 0.0–0.7)
HEMATOCRIT: 29.7 % — AB (ref 39.0–52.0)
HEMOGLOBIN: 10.3 g/dL — AB (ref 13.0–17.0)
LYMPHS PCT: 15.6 % (ref 12.0–46.0)
Lymphs Abs: 1.9 10*3/uL (ref 0.7–4.0)
MCHC: 34.8 g/dL (ref 30.0–36.0)
MCV: 93.7 fl (ref 78.0–100.0)
MONOS PCT: 8.7 % (ref 3.0–12.0)
Monocytes Absolute: 1.1 10*3/uL — ABNORMAL HIGH (ref 0.1–1.0)
NEUTROS ABS: 9.3 10*3/uL — AB (ref 1.4–7.7)
Neutrophils Relative %: 75.1 % (ref 43.0–77.0)
PLATELETS: 318 10*3/uL (ref 150.0–400.0)
RBC: 3.17 Mil/uL — ABNORMAL LOW (ref 4.22–5.81)
RDW: 18.5 % — AB (ref 11.5–15.5)
WBC: 12.3 10*3/uL — ABNORMAL HIGH (ref 4.0–10.5)

## 2015-12-07 LAB — AMMONIA: AMMONIA: 52 umol/L — AB (ref 11–35)

## 2015-12-07 MED ORDER — LACTULOSE 10 GM/15ML PO SOLN: 20.0000 g | Freq: Every day | ORAL | 6 refills | Status: DC | PRN

## 2015-12-07 MED ORDER — SPIRONOLACTONE 50 MG PO TABS: ORAL_TABLET | ORAL | 6 refills | Status: DC

## 2015-12-07 MED ORDER — FUROSEMIDE 40 MG PO TABS: 40.0000 mg | ORAL_TABLET | Freq: Every day | ORAL | 6 refills | Status: DC

## 2015-12-07 NOTE — Patient Instructions (Addendum)
Please go to the basement level to have your labs drawn.  Stay on a 2 Gram sodium diet.  Come to our lab on 9-20 for labs.  You have been scheduled for an endoscopy. Please follow written instructions given to you at your visit today. If you use inhalers (even only as needed), please bring them with you on the day of your procedure.   You have been scheduled for an abdominal ultrasound Paracentesis at [facility] (1st floor of hospital) on Friday 12-09-2015 at 10:00 am. Please arrive at 9:45 am  to your appointment for registration.   We sent refills to your pharmacy . 1. Lasix 2. Aldactone 3. Chronulac

## 2015-12-07 NOTE — Progress Notes (Signed)
Agree with the assessment and plan as outlined with the following additions. Patient had labs for chronic liver diseases done while inpatient. Highly suspect alcohol driving this process, but ceruloplasmin is low. I think Wilson's is unlikely but a 24 HR urine copper should be done to ensure normal. He also had a positive SMA but negative ANA. We can obtain a total IgG level to ensure normal. Finally, he has an iron deficiency anemia with recent rectal bleeding (h/o hematochezia). If you think he is now stable for EGD and colonoscopy can you please have nursing staff call to coordinate this. He also needs the EGD to screen for varices. Thanks

## 2015-12-07 NOTE — Progress Notes (Signed)
Subjective:    Patient ID: Todd Higgins, male    DOB: Jan 14, 1971, 45 y.o.   MRN: [id number]  HPI Colum Is a 45 year old white male recently recently established with GI with new diagnosis of decompensated alcoholic liver disease. Patient was seen in our office on 11/24/2015 and hospitalized. Add severe hyponatremia at that time with a sodium level of 120 and tense ascites. He underwent 4 L paracentesis during his hospitalization, there was no evidence of SBP, and was started on diuretics. Additional labs were done to rule out other etiology of chronic liver disease and these were unremarkable, alpha-fetoprotein level is normal. Itching comes in today for post hospital follow-up. He has been taking Lasix 20 mg by mouth daily and Aldactone 2 mg by mouth daily. His weight is down about 11 pounds since hospitalization but he feels that his abdomen is "feeling back up with fluid. He feels.full and uncomfortable and says he can't eat much because he feels so full.  Denies shortness of breath. He has had problems with constipation, he's been using lactulose as needed which is effective. Patient says he stopped drinking the first week of August and has not had any alcohol since that time. He has gotten himself established with AA and has a sponsor though he is not been going to regular meetings. He says he is following a low-sodium diet  Review of Systems Pertinent positive and negative review of systems were noted in the above HPI section.  All other review of systems was otherwise negative.  Outpatient Encounter Prescriptions as of 12/07/2015  Medication Sig  . buPROPion (WELLBUTRIN XL) 300 MG 24 hr tablet Take 1 tablet (300 mg total) by mouth daily.  . feeding supplement (BOOST HIGH PROTEIN) LIQD Take 1 Container by mouth every morning.  . ferrous sulfate 325 (65 FE) MG tablet Take 1 tablet (325 mg total) by mouth 2 (two) times daily with a meal.  . furosemide (LASIX) 40 MG tablet Take 1 tablet (40 mg  total) by mouth daily. For morning swelling in legs and abdomen  . lactulose (CHRONULAC) 10 GM/15ML solution Take 30 mLs (20 g total) by mouth daily as needed for mild constipation.  . Multiple Vitamin (MULTIVITAMIN WITH MINERALS) TABS tablet Take 1 tablet by mouth daily.  Marland Kitchen omeprazole (PRILOSEC) 20 MG capsule Take 20 mg by mouth daily as needed (acid reflux/).   Marland Kitchen spironolactone (ALDACTONE) 50 MG tablet Take 2 tab each morninig.  . [DISCONTINUED] furosemide (LASIX) 40 MG tablet Take 0.5 tablets (20 mg total) by mouth daily. For morning swelling in legs and abdomen  . [DISCONTINUED] lactulose (CHRONULAC) 10 GM/15ML solution Take 30 mLs (20 g total) by mouth daily as needed for mild constipation.  . [DISCONTINUED] spironolactone (ALDACTONE) 50 MG tablet Take 1 tablet (50 mg total) by mouth daily.   No facility-administered encounter medications on file as of 12/07/2015.    No Known Allergies Patient Active Problem List   Diagnosis Date Noted  . Ascites   . Hematochezia   . Decompensated hepatic cirrhosis ([place]) 11/24/2015  . Coagulopathy ([place]) 11/24/2015  . Anemia 11/24/2015  . Hyponatremia 11/24/2015  . Hypotension 11/24/2015  . Anasarca 11/24/2015  . Constipation 11/24/2015  . Cirrhosis of liver ([place]) 11/14/2015  . Alcoholic gastritis 94/85/4627  . Malnutrition of mild degree ([place]) 11/08/2015  . Mood disorder ([place]) 11/08/2015  . Alcohol use disorder ([place])   . Palpitations 11/27/2012   Social History   Social History  . Marital status: Single  Spouse name: N/A  . Number of children: 0  . Years of education: N/A   Occupational History  . 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 Smoking status: Current Every Day Smoker    Packs/day: 1.00    Years: 25.00  . Smokeless tobacco: Never Used  . Alcohol use No     Comment: quit drinking 11/07/15  . Drug use: No  . Sexual activity: Not on file   Other Topics Concern  . Not on file    Social History Narrative  . No narrative on file    Mr. Diguglielmo's family history includes Alcohol abuse in his father; Diabetes in his maternal grandfather and paternal grandmother; Drug abuse in his maternal uncle; Heart disease in his maternal grandmother; Hypertension in his father and mother; Mental illness in his father; Prostate cancer in his father and maternal uncle; Stroke in his maternal grandfather and maternal uncle.      Objective:    Vitals:   12/07/15 0923  BP: 110/60  Pulse: 88    Physical Exam  well-developed thin chronically ill-appearing white male in no acute distress blood pressure 110/60 pulse 88, height 5 foot 10 weight 142 BMI of 20.3-weight documented at 153 prior to recent hospitalization , HEENT ;nontraumatic normocephalic EOMI PERRLA sclera show early icterus cardiovascular regular rate and rhythm with S1-S2 no murmur rub or gallop, Pulmonary; clear bilaterally, Abdomen; fairly tense ascites nonfocal abdominal tenderness no palpable mass or hepatosplenomegaly bowel sounds are present, Rectal ;exam not done, Extremities; 1+ edema from the shins and below,, Neuropsych; mood and affect appropriate, no asterixis       Assessment & Plan:   #39 45 year old white male with decompensated alcoholic liver disease complicated by ascites and marked hyponatremia Patient was hospitalized 2 weeks ago, underwent paracentesis and has started diuretics. Weight is down 11 pounds but complaining about abdominal pain and reaccumulation of fluid today #2 normocytic anemia  Plan; strict 2 g sodium diet  Pt commended for stopping ETOH Will schedule for large-volume paracentesis with removal of 3-4 L a Patient will be given IV albumin if 4 or more liters removed Increase Lasix to 40 mg by mouth daily Increase Aldactone 200 mg by mouth daily We'll check CBC with differential, see met ammonia and INR today Plan to repeat BMET n 2 weeks with increase in diuretics Patient will  follow up with Dr. Havery Moros in one month   Addendum; PT was scheduled for EGD with Dr Havery Moros during office visit.  to screen for varices. Procedure discussed in detail and pt agreeable to proceed. Pt did not feel he would be able to tolerate prep for Colonoscopy well at this time so we opted to defer this to a later date.    Keelan Tripodi S Richardo Popoff PA-C 12/07/2015   Cc: Venia Carbon, MD

## 2015-12-09 ENCOUNTER — Ambulatory Visit (HOSPITAL_COMMUNITY)
Admission: RE | Admit: 2015-12-09 | Discharge: 2015-12-09 | Disposition: A | Discharge status: Home / Self Care | Payer: BLUE CROSS/BLUE SHIELD | Source: Ambulatory Visit | Attending: Physician Assistant | Admitting: Physician Assistant

## 2015-12-09 ENCOUNTER — Encounter (HOSPITAL_COMMUNITY): Payer: Self-pay

## 2015-12-09 DIAGNOSIS — R188 Other ascites: Secondary | ICD-10-CM | POA: Diagnosis not present

## 2015-12-09 DIAGNOSIS — K7031 Alcoholic cirrhosis of liver with ascites: Secondary | ICD-10-CM

## 2015-12-09 LAB — GRAM STAIN

## 2015-12-09 LAB — BODY FLUID CELL COUNT WITH DIFFERENTIAL
LYMPHS FL: 14 %
Monocyte-Macrophage-Serous Fluid: 81 % (ref 50–90)
Neutrophil Count, Fluid: 5 % (ref 0–25)
Total Nucleated Cell Count, Fluid: 170 cu mm (ref 0–1000)

## 2015-12-09 MED ORDER — ALBUMIN HUMAN 25 % IV SOLN
50.0000 g | Freq: Once | INTRAVENOUS | Status: AC
  Administered 2015-12-09: 50 g via INTRAVENOUS
  Filled 2015-12-09: qty 200

## 2015-12-09 MED ORDER — SODIUM CHLORIDE 0.9 % IV SOLN
Freq: Once | INTRAVENOUS | Status: AC
  Administered 2015-12-09: 12:00:00 via INTRAVENOUS

## 2015-12-09 NOTE — Discharge Instructions (Signed)
Albumin injection What is this medicine? ALBUMIN (al BYOO min) is used to treat or prevent shock following serious injury, bleeding, surgery, or burns by increasing the volume of blood plasma. This medicine can also replace low blood protein. This medicine may be used for other purposes; ask your health care provider or pharmacist if you have questions. What should I tell my health care provider before I take this medicine? They need to know if you have any of the following conditions: -anemia -heart disease -kidney disease -an unusual or allergic reaction to albumin, other medicines, foods, dyes, or preservatives -pregnant or trying to get pregnant -breast-feeding How should I use this medicine? This medicine is for infusion into a vein. It is given by a health-care professional in a hospital or clinic. Talk to your pediatrician regarding the use of this medicine in children. While this drug may be prescribed for selected conditions, precautions do apply. Overdosage: If you think you have taken too much of this medicine contact a poison control center or emergency room at once. NOTE: This medicine is only for you. Do not share this medicine with others. What if I miss a dose? This does not apply. What may interact with this medicine? Interactions are not expected. This list may not describe all possible interactions. Give your health care provider a list of all the medicines, herbs, non-prescription drugs, or dietary supplements you use. Also tell them if you smoke, drink alcohol, or use illegal drugs. Some items may interact with your medicine. What should I watch for while using this medicine? Your condition will be closely monitored while you receive this medicine. Some products are derived from human plasma, and there is a small risk that these products may contain certain types of virus or bacteria. All products are processed to kill most viruses and bacteria. If you have questions  concerning the risk of infections, discuss them with your doctor or health care professional. What side effects may I notice from receiving this medicine? Side effects that you should report to your doctor or health care professional as soon as possible: -allergic reactions like skin rash, itching or hives, swelling of the face, lips, or tongue -breathing problems -changes in heartbeat -fever, chills -pain, redness or swelling at the injection site -signs of viral infection including fever, drowsiness, chills, runny nose followed in about 2 weeks by a rash and joint pain -tightness in the chest Side effects that usually do not require medical attention (report to your doctor or health care professional if they continue or are bothersome): -increased salivation -nausea, vomiting This list may not describe all possible side effects. Call your doctor for medical advice about side effects. You may report side effects to FDA at 1-800-FDA-1088. Where should I keep my medicine? This does not apply. You will not be given this medicine to store at home. NOTE: This sheet is a summary. It may not cover all possible information. If you have questions about this medicine, talk to your doctor, pharmacist, or health care provider.    2016, Elsevier/Gold Standard. (2007-06-12 10:18:55) Paracentesis, Care After Refer to this sheet in the next few weeks. These instructions provide you with information about caring for yourself after your procedure. Your health care provider may also give you more specific instructions. Your treatment has been planned according to current medical practices, but problems sometimes occur. Call your health care provider if you have any problems or questions after your procedure. WHAT TO EXPECT AFTER THE PROCEDURE After your  procedure, it is common to have a small amount of clear fluid coming from the puncture site. HOME CARE INSTRUCTIONS  Return to your normal activities as told  by your health care provider. Ask your health care provider what activities are safe for you.  Take over-the-counter and prescription medicines only as told by your health care provider.  Do not take baths, swim, or use a hot tub until your health care provider approves.  Follow instructions from your health care provider about:  How to take care of your puncture site.  When and how you should change your bandage (dressing).  When you should remove your dressing.  Check your puncture area every day signs of infection. Watch for:  Redness, swelling, or pain.  Fluid, blood, or pus.  Keep all follow-up visits as told by your health care provider. This is important. SEEK MEDICAL CARE IF:  You have redness, swelling, or pain at your puncture site.  You start to have more clear fluid coming from your puncture site.  You have blood or pus coming from your puncture site.  You have chills.  You have a fever. SEEK IMMEDIATE MEDICAL CARE IF:  You develop chest pain or shortness of breath.  You develop increasing pain, discomfort, or swelling in your abdomen.  You feel dizzy or light-headed or you pass out.   This information is not intended to replace advice given to you by your health care provider. Make sure you discuss any questions you have with your health care provider.   Document Released: 08/03/2014 Document Reviewed: 08/03/2014 Elsevier Interactive Patient Education [employer].

## 2015-12-09 NOTE — Discharge Instructions (Signed)
Paracentesis, Care After Refer to this sheet in the next few weeks. These instructions provide you with information about caring for yourself after your procedure. Your health care provider may also give you more specific instructions. Your treatment has been planned according to current medical practices, but problems sometimes occur. Call your health care provider if you have any problems or questions after your procedure. WHAT TO EXPECT AFTER THE PROCEDURE After your procedure, it is common to have a small amount of clear fluid coming from the puncture site. HOME CARE INSTRUCTIONS  Return to your normal activities as told by your health care provider. Ask your health care provider what activities are safe for you.  Take over-the-counter and prescription medicines only as told by your health care provider.  Do not take baths, swim, or use a hot tub until your health care provider approves.  Follow instructions from your health care provider about:  How to take care of your puncture site.  When and how you should change your bandage (dressing).  When you should remove your dressing.  Check your puncture area every day signs of infection. Watch for:  Redness, swelling, or pain.  Fluid, blood, or pus.  Keep all follow-up visits as told by your health care provider. This is important. SEEK MEDICAL CARE IF:  You have redness, swelling, or pain at your puncture site.  You start to have more clear fluid coming from your puncture site.  You have blood or pus coming from your puncture site.  You have chills.  You have a fever. SEEK IMMEDIATE MEDICAL CARE IF:  You develop chest pain or shortness of breath.  You develop increasing pain, discomfort, or swelling in your abdomen.  You feel dizzy or light-headed or you pass out.   This information is not intended to replace advice given to you by your health care provider. Make sure you discuss any questions you have with your health  care provider.   Document Released: 08/03/2014 Document Reviewed: 08/03/2014 Elsevier Interactive Patient Education 2016 Elsevier Inc. Albumin injection What is this medicine? ALBUMIN (al BYOO min) is used to treat or prevent shock following serious injury, bleeding, surgery, or burns by increasing the volume of blood plasma. This medicine can also replace low blood protein. This medicine may be used for other purposes; ask your health care provider or pharmacist if you have questions. What should I tell my health care provider before I take this medicine? They need to know if you have any of the following conditions: -anemia -heart disease -kidney disease -an unusual or allergic reaction to albumin, other medicines, foods, dyes, or preservatives -pregnant or trying to get pregnant -breast-feeding How should I use this medicine? This medicine is for infusion into a vein. It is given by a health-care professional in a hospital or clinic. Talk to your pediatrician regarding the use of this medicine in children. While this drug may be prescribed for selected conditions, precautions do apply. Overdosage: If you think you have taken too much of this medicine contact a poison control center or emergency room at once. NOTE: This medicine is only for you. Do not share this medicine with others. What if I miss a dose? This does not apply. What may interact with this medicine? Interactions are not expected. This list may not describe all possible interactions. Give your health care provider a list of all the medicines, herbs, non-prescription drugs, or dietary supplements you use. Also tell them if you smoke, drink alcohol, or  use illegal drugs. Some items may interact with your medicine. What should I watch for while using this medicine? Your condition will be closely monitored while you receive this medicine. Some products are derived from human plasma, and there is a small risk that these  products may contain certain types of virus or bacteria. All products are processed to kill most viruses and bacteria. If you have questions concerning the risk of infections, discuss them with your doctor or health care professional. What side effects may I notice from receiving this medicine? Side effects that you should report to your doctor or health care professional as soon as possible: -allergic reactions like skin rash, itching or hives, swelling of the face, lips, or tongue -breathing problems -changes in heartbeat -fever, chills -pain, redness or swelling at the injection site -signs of viral infection including fever, drowsiness, chills, runny nose followed in about 2 weeks by a rash and joint pain -tightness in the chest Side effects that usually do not require medical attention (report to your doctor or health care professional if they continue or are bothersome): -increased salivation -nausea, vomiting This list may not describe all possible side effects. Call your doctor for medical advice about side effects. You may report side effects to FDA at 1-800-FDA-1088. Where should I keep my medicine? This does not apply. You will not be given this medicine to store at home. NOTE: This sheet is a summary. It may not cover all possible information. If you have questions about this medicine, talk to your doctor, pharmacist, or health care provider.    2016, Elsevier/Gold Standard. (2007-06-12 10:18:55)

## 2015-12-09 NOTE — Procedures (Signed)
Ultrasound-guided diagnostic and therapeutic paracentesis performed yielding 4 liters (maximum ordered) of yellow  fluid. No immediate complications. The fluid was sent to the lab for preordered studies. The pt will receive 50g  IV albumin postprocedure.

## 2015-12-11 ENCOUNTER — Other Ambulatory Visit: Payer: Self-pay | Admitting: Internal Medicine

## 2015-12-14 LAB — CULTURE, BODY FLUID-BOTTLE: CULTURE: NO GROWTH

## 2015-12-14 LAB — CULTURE, BODY FLUID W GRAM STAIN -BOTTLE

## 2015-12-15 ENCOUNTER — Encounter: Payer: Self-pay | Admitting: Internal Medicine

## 2015-12-15 ENCOUNTER — Ambulatory Visit (INDEPENDENT_AMBULATORY_CARE_PROVIDER_SITE_OTHER): Payer: BLUE CROSS/BLUE SHIELD | Admitting: Internal Medicine

## 2015-12-15 DIAGNOSIS — K7031 Alcoholic cirrhosis of liver with ascites: Secondary | ICD-10-CM

## 2015-12-15 DIAGNOSIS — Z720 Tobacco use: Secondary | ICD-10-CM

## 2015-12-15 DIAGNOSIS — E441 Mild protein-calorie malnutrition: Secondary | ICD-10-CM

## 2015-12-15 DIAGNOSIS — F17210 Nicotine dependence, cigarettes, uncomplicated: Secondary | ICD-10-CM | POA: Insufficient documentation

## 2015-12-15 DIAGNOSIS — F39 Unspecified mood [affective] disorder: Secondary | ICD-10-CM | POA: Diagnosis not present

## 2015-12-15 NOTE — Assessment & Plan Note (Signed)
Clearly improved Not really jaundiced Ascites mostly hasn't reaccumulated Appetite improving Due for GI follow up and EGD Will hold furosemide if home weight under 125#

## 2015-12-15 NOTE — Progress Notes (Signed)
Subjective:    Patient ID: Todd Higgins, male    DOB: 1971-01-12, 45 y.o.   MRN: [id number]  HPI Here for follow up of cirrhosis  Mom is with him  Feels better since paracentesis 4 liters were removed Still some fluid in lower abdomen but much better in upper abdomen Hasn't reacumulated as far as he can tell More comfortable sitting/standing, etc Still some lower abdominal discomfort  Appetite is some better Having trouble finding palatable food that is low salt  No alcohol--just got 30 day chip Still going to AA--many days Keeps up with his sponsor  Gets antsy and agitated at times Still gets very anxious Will have occasional shakes Still very afraid of dying  Current Outpatient Prescriptions on File Prior to Visit  Medication Sig Dispense Refill  . buPROPion (WELLBUTRIN XL) 300 MG 24 hr tablet Take 1 tablet (300 mg total) by mouth daily. 30 tablet 2  . feeding supplement (BOOST HIGH PROTEIN) LIQD Take 1 Container by mouth every morning.    . ferrous sulfate 325 (65 FE) MG tablet Take 1 tablet (325 mg total) by mouth 2 (two) times daily with a meal. 60 tablet 3  . furosemide (LASIX) 40 MG tablet Take 1 tablet (40 mg total) by mouth daily. For morning swelling in legs and abdomen 30 tablet 6  . lactulose (CHRONULAC) 10 GM/15ML solution Take 30 mLs (20 g total) by mouth daily as needed for mild constipation. 240 mL 6  . Multiple Vitamin (MULTIVITAMIN WITH MINERALS) TABS tablet Take 1 tablet by mouth daily.    Marland Kitchen omeprazole (PRILOSEC) 20 MG capsule Take 20 mg by mouth daily as needed (acid reflux/).     Marland Kitchen spironolactone (ALDACTONE) 50 MG tablet Take 2 tab each morninig. 60 tablet 6   No current facility-administered medications on file prior to visit.     No Known Allergies  Past Medical History:  Diagnosis Date  . Alcohol use disorder ([place])   . Anxiety   . Arrhythmia   . Family history of prostate cancer   . Tobacco dependence     Past Surgical History:    Procedure Laterality Date  . APPENDECTOMY  1981  . HAND SURGERY Left 2013  . HERNIA REPAIR  1990    Family History  Problem Relation Age of Onset  . Hypertension Mother   . Hypertension Father   . Prostate cancer Father   . Alcohol abuse Father   . Mental illness Father   . Heart disease Maternal Grandmother   . Stroke Maternal Grandfather   . Diabetes Maternal Grandfather   . Diabetes Paternal Grandmother   . Drug abuse Maternal Uncle   . Stroke Maternal Uncle   . Prostate cancer Maternal Uncle     Social History   Social History  . Marital status: Single    Spouse name: N/A  . Number of children: 0  . Years of education: N/A   Occupational History  . 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 Smoking status: Current Every Day Smoker    Packs/day: 1.00    Years: 25.00  . Smokeless tobacco: Never Used  . Alcohol use No     Comment: quit drinking 11/07/15  . Drug use: No  . Sexual activity: Not on file   Other Topics Concern  . Not on file   Social History Narrative  . No narrative on file   Review of  Systems Weighing daily--stable since the paracentesis Bowels are slow--- on the lactulose No urinary problems No open areas or sores    Objective:   Physical Exam  Neck: No thyromegaly present.  Cardiovascular: Normal rate, regular rhythm and normal heart sounds.  Exam reveals no gallop.   No murmur heard. Pulmonary/Chest: Effort normal. No respiratory distress. He has no wheezes. He has no rales.  Still with slight dullness and decreased breath sounds at bases--but improved  Abdominal: Soft. He exhibits no distension. There is no rebound and no guarding.  Mild lower abdominal tenderness  Musculoskeletal:  No edema!  Lymphadenopathy:    He has no cervical adenopathy.  Skin:  No longer clearly jaundiced  Psychiatric:  Still anxious but clearly calmer          Assessment & Plan:

## 2015-12-15 NOTE — Patient Instructions (Signed)
Please try a 21mg  or 22mg  nicotine patch daily--and stop smoking. Don't take the furosemide any day your morning weight is under 125#

## 2015-12-15 NOTE — Progress Notes (Signed)
Pre visit review using our clinic review tool, if applicable. No additional management support is needed unless otherwise documented below in the visit note. 

## 2015-12-15 NOTE — Assessment & Plan Note (Signed)
Eating a little better

## 2015-12-15 NOTE — Assessment & Plan Note (Signed)
Still very anxious but some improvement

## 2015-12-15 NOTE — Assessment & Plan Note (Signed)
He did well with patch in hospital Will resume now and hopefully stop

## 2015-12-22 ENCOUNTER — Telehealth: Payer: Self-pay | Admitting: Physician Assistant

## 2015-12-22 ENCOUNTER — Other Ambulatory Visit (INDEPENDENT_AMBULATORY_CARE_PROVIDER_SITE_OTHER): Payer: BLUE CROSS/BLUE SHIELD

## 2015-12-22 DIAGNOSIS — K7031 Alcoholic cirrhosis of liver with ascites: Secondary | ICD-10-CM

## 2015-12-22 LAB — BASIC METABOLIC PANEL
BUN: 8 mg/dL (ref 6–23)
CALCIUM: 8.6 mg/dL (ref 8.4–10.5)
CO2: 31 mEq/L (ref 19–32)
Chloride: 92 mEq/L — ABNORMAL LOW (ref 96–112)
Creatinine, Ser: 0.9 mg/dL (ref 0.40–1.50)
GFR: 95.9 mL/min (ref >60.00)
GLUCOSE: 106 mg/dL — AB (ref 70–99)
POTASSIUM: 3.5 meq/L (ref 3.5–5.1)
SODIUM: 128 meq/L — AB (ref 135–145)

## 2015-12-22 NOTE — Telephone Encounter (Signed)
Due repeat labs, but wants to get it at his PCP. Order is in under doctor here. Okay with [place] if it is okay with PCP

## 2016-01-02 ENCOUNTER — Ambulatory Visit: Payer: BLUE CROSS/BLUE SHIELD | Admitting: Internal Medicine

## 2016-01-09 ENCOUNTER — Ambulatory Visit (AMBULATORY_SURGERY_CENTER): Payer: BLUE CROSS/BLUE SHIELD | Admitting: Gastroenterology

## 2016-01-09 ENCOUNTER — Encounter: Payer: Self-pay | Admitting: Gastroenterology

## 2016-01-09 VITALS — BP 110/72 | HR 81 | Temp 99.3°F | Resp 16 | Ht 70.0 in | Wt 128.0 lb

## 2016-01-09 DIAGNOSIS — K7031 Alcoholic cirrhosis of liver with ascites: Secondary | ICD-10-CM

## 2016-01-09 DIAGNOSIS — K7460 Unspecified cirrhosis of liver: Secondary | ICD-10-CM | POA: Diagnosis not present

## 2016-01-09 DIAGNOSIS — K2950 Unspecified chronic gastritis without bleeding: Secondary | ICD-10-CM | POA: Diagnosis not present

## 2016-01-09 MED ORDER — SODIUM CHLORIDE 0.9 % IV SOLN: 500.0000 mL | INTRAVENOUS | Status: DC

## 2016-01-09 MED ORDER — NADOLOL 40 MG PO TABS: 40.0000 mg | ORAL_TABLET | Freq: Every day | ORAL | 3 refills | Status: DC

## 2016-01-09 NOTE — Progress Notes (Signed)
Report given to PACU RN, vss 

## 2016-01-09 NOTE — Op Note (Signed)
[place] Patient Name: Todd Higgins Procedure Date: 01/09/2016 3:28 PM MRN: [id number] Endoscopist: Remo Lipps P. Havery Moros , MD Age: 45 Referring MD:  Date of Birth: 07-16-1970 Gender: Male Account #: [id number] Procedure:                Upper GI endoscopy Indications:              history of decompensated cirrhosis, screening for                            esophageal varices Medicines:                Monitored Anesthesia Care Procedure:                Pre-Anesthesia Assessment:                           - Prior to the procedure, a History and Physical                            was performed, and patient medications and                            allergies were reviewed. The patient's tolerance of                            previous anesthesia was also reviewed. The risks                            and benefits of the procedure and the sedation                            options and risks were discussed with the patient.                            All questions were answered, and informed consent                            was obtained. Prior Anticoagulants: The patient has                            taken no previous anticoagulant or antiplatelet                            agents. ASA Grade Assessment: III - A patient with                            severe systemic disease. After reviewing the risks                            and benefits, the patient was deemed in                            satisfactory condition to undergo the procedure.  After obtaining informed consent, the endoscope was                            passed under direct vision. Throughout the                            procedure, the patient's blood pressure, pulse, and                            oxygen saturations were monitored continuously. The                            Model GIF-HQ190 [id number]) scope was introduced                            through the mouth, and  advanced to the second part                            of duodenum. The upper GI endoscopy was                            accomplished without difficulty. The patient                            tolerated the procedure well. Scope In: Scope Out: Findings:                 Esophagogastric landmarks were identified: the                            Z-line was found at 38 cm, the gastroesophageal                            junction was found at 38 cm and the upper extent of                            the gastric folds was found at 41 cm from the                            incisors.                           A 3 cm hiatal hernia was present.                           Multiple columns of large varices were found in the                            middle third of the esophagus and in the lower                            third of the esophagus. There was some mild  erythema at the GEJ but no high risk stigmata for                            bleeding.                           The exam of the esophagus was otherwise normal.                           Diffuse mild inflammation characterized by erythema                            and friability was found in the entire examined                            stomach, consistent with portal hypertensive                            gastritis. Biopsies were taken with a cold forceps                            for Helicobacter pylori testing.                           The exam of the stomach was otherwise normal.                           The duodenal bulb and second portion of the                            duodenum were normal. Complications:            No immediate complications. Estimated blood loss:                            Minimal. Estimated Blood Loss:     Estimated blood loss was minimal. Impression:               - Esophagogastric landmarks identified.                           - 3 cm hiatal hernia.                            - Esophageal varices noted as above.                           - Gastritis, suspected related to portal                            hypertension. Biopsied to rule out H pylori.                           - Normal duodenal bulb and second portion of the  duodenum. Recommendation:           - Patient has a contact number available for                            emergencies. The signs and symptoms of potential                            delayed complications were discussed with the                            patient. Return to normal activities tomorrow.                            Written discharge instructions were provided to the                            patient.                           - Resume previous diet.                           - Continue present medications                           - Start nadolol 40mg  daily if tolerated for                            treatment of varices                           - No ibuprofen, naproxen, or other non-steroidal                            anti-inflammatory drugs.                           - Await pathology results.                           - No repeat upper endoscopy for varices screening                           - Schedule colonoscopy for further evaluation of                            iron deficiency anemia                           - Schedule a follow up clinic visit with me for                            reassessment. Remo Lipps P. Armbruster, MD 01/09/2016 3:54:07 PM This report has been signed electronically.

## 2016-01-09 NOTE — Progress Notes (Signed)
Called to room to assist during endoscopic procedure.  Patient ID and intended procedure confirmed with present staff. Received instructions for my participation in the procedure from the performing physician.  

## 2016-01-09 NOTE — Patient Instructions (Signed)
No ibuprofen,naproxen,or other non-steroidal anti-inflammatory drugs. Discharge instructions given. Handout on a hiatal hernia and gastritis. Resume previous medications. Dr. Havery Moros will wait until patient follow up appointment on the 23th of this month to schedule colonoscopy. YOU HAD AN ENDOSCOPIC PROCEDURE TODAY AT [facility]:   Refer to the procedure report that was given to you for any specific questions about what was found during the examination.  If the procedure report does not answer your questions, please call your gastroenterologist to clarify.  If you requested that your care partner not be given the details of your procedure findings, then the procedure report has been included in a sealed envelope for you to review at your convenience later.  YOU SHOULD EXPECT: Some feelings of bloating in the abdomen. Passage of more gas than usual.  Walking can help get rid of the air that was put into your GI tract during the procedure and reduce the bloating. If you had a lower endoscopy (such as a colonoscopy or flexible sigmoidoscopy) you may notice spotting of blood in your stool or on the toilet paper. If you underwent a bowel prep for your procedure, you may not have a normal bowel movement for a few days.  Please Note:  You might notice some irritation and congestion in your nose or some drainage.  This is from the oxygen used during your procedure.  There is no need for concern and it should clear up in a day or so.  SYMPTOMS TO REPORT IMMEDIATELY:    Following upper endoscopy (EGD)  Vomiting of blood or coffee ground material  New chest pain or pain under the shoulder blades  Painful or persistently difficult swallowing  New shortness of breath  Fever of 100F or higher  Black, tarry-looking stools  For urgent or emergent issues, a gastroenterologist can be reached at any hour by calling [phone].   DIET:  We do recommend a small meal at first, but then  you may proceed to your regular diet.  Drink plenty of fluids but you should avoid alcoholic beverages for 24 hours.  ACTIVITY:  You should plan to take it easy for the rest of today and you should NOT DRIVE or use heavy machinery until tomorrow (because of the sedation medicines used during the test).    FOLLOW UP: Our staff will call the number listed on your records the next business day following your procedure to check on you and address any questions or concerns that you may have regarding the information given to you following your procedure. If we do not reach you, we will leave a message.  However, if you are feeling well and you are not experiencing any problems, there is no need to return our call.  We will assume that you have returned to your regular daily activities without incident.  If any biopsies were taken you will be contacted by phone or by letter within the next 1-3 weeks.  Please call us at [phone] if you have not heard about the biopsies in 3 weeks.    SIGNATURES/CONFIDENTIALITY: You and/or your care partner have signed paperwork which will be entered into your electronic medical record.  These signatures attest to the fact that that the information above on your After Visit Summary has been reviewed and is understood.  Full responsibility of the confidentiality of this discharge information lies with you and/or your care-partner.

## 2016-01-10 ENCOUNTER — Telehealth: Payer: Self-pay

## 2016-01-10 NOTE — Telephone Encounter (Signed)
  Follow up Call-  Call back number 01/09/2016  Post procedure Call Back phone  # [phone] cell  Permission to leave phone message Yes  Some recent data might be hidden     Patient questions:  Do you have a fever, pain , or abdominal swelling? No. Pain Score  0 *  Have you tolerated food without any problems? Yes.    Have you been able to return to your normal activities? Yes.    Do you have any questions about your discharge instructions: Diet   No. Medications  No. Follow up visit  No.  Do you have questions or concerns about your Care? No.  Actions: * If pain score is 4 or above: No action needed, pain <4.

## 2016-01-13 ENCOUNTER — Telehealth: Payer: Self-pay

## 2016-01-13 NOTE — Telephone Encounter (Signed)
Left vm to call back, want to discuss date of colonoscopy.

## 2016-01-16 ENCOUNTER — Telehealth: Payer: Self-pay

## 2016-01-16 NOTE — Telephone Encounter (Signed)
That's fine, thanks!

## 2016-01-16 NOTE — Telephone Encounter (Signed)
Spoke to patient made appointment for follow up on 10/23 @ 3:45. Patient wanted to wait to discuss colonoscopy at next visit and did not want to schedule that today.

## 2016-01-20 ENCOUNTER — Other Ambulatory Visit: Payer: BLUE CROSS/BLUE SHIELD

## 2016-01-23 ENCOUNTER — Encounter: Payer: Self-pay | Admitting: Gastroenterology

## 2016-01-23 ENCOUNTER — Ambulatory Visit (INDEPENDENT_AMBULATORY_CARE_PROVIDER_SITE_OTHER): Payer: BLUE CROSS/BLUE SHIELD | Admitting: Gastroenterology

## 2016-01-23 ENCOUNTER — Other Ambulatory Visit (INDEPENDENT_AMBULATORY_CARE_PROVIDER_SITE_OTHER): Payer: BLUE CROSS/BLUE SHIELD

## 2016-01-23 VITALS — BP 110/70 | HR 80 | Ht 70.0 in | Wt 129.2 lb

## 2016-01-23 DIAGNOSIS — K7031 Alcoholic cirrhosis of liver with ascites: Secondary | ICD-10-CM

## 2016-01-23 DIAGNOSIS — I8500 Esophageal varices without bleeding: Secondary | ICD-10-CM

## 2016-01-23 DIAGNOSIS — D509 Iron deficiency anemia, unspecified: Secondary | ICD-10-CM

## 2016-01-23 DIAGNOSIS — K7460 Unspecified cirrhosis of liver: Secondary | ICD-10-CM

## 2016-01-23 DIAGNOSIS — R188 Other ascites: Secondary | ICD-10-CM | POA: Diagnosis not present

## 2016-01-23 LAB — COMPREHENSIVE METABOLIC PANEL
ALT: 11 U/L (ref 0–53)
AST: 30 U/L (ref 0–37)
Albumin: 3.2 g/dL — ABNORMAL LOW (ref 3.5–5.2)
Alkaline Phosphatase: 93 U/L (ref 39–117)
BUN: 10 mg/dL (ref 6–23)
CHLORIDE: 100 meq/L (ref 96–112)
CO2: 29 meq/L (ref 19–32)
Calcium: 9.3 mg/dL (ref 8.4–10.5)
Creatinine, Ser: 0.86 mg/dL (ref 0.40–1.50)
GFR: 101.03 mL/min (ref >60.00)
GLUCOSE: 92 mg/dL (ref 70–99)
POTASSIUM: 3.8 meq/L (ref 3.5–5.1)
SODIUM: 134 meq/L — AB (ref 135–145)
TOTAL PROTEIN: 7.6 g/dL (ref 6.0–8.3)
Total Bilirubin: 1 mg/dL (ref 0.2–1.2)

## 2016-01-23 LAB — PROTIME-INR
INR: 1.4 ratio — ABNORMAL HIGH (ref 0.8–1.0)
Prothrombin Time: 14.9 s — ABNORMAL HIGH (ref 9.6–13.1)

## 2016-01-23 MED ORDER — NA SULFATE-K SULFATE-MG SULF 17.5-3.13-1.6 GM/177ML PO SOLN: 1.0000 | Freq: Once | ORAL | 0 refills | Status: AC

## 2016-01-23 NOTE — Progress Notes (Signed)
HPI :  45 y/o male with past medical history as outlined below, here for a follow-up visit for newly diagnosed decompensated alcoholic related cirrhosis. He was previously hospitalized in August for severe hyponatremia and tense ascites for which she underwent paracentesis, there was no evidence of SBP. He was started on diuretics at the time and had slowly increased to what he now takes is Lasix 40 mg by mouth once daily and Aldactone 100 mg by mouth once daily. His ascites is considerably improved at this time did not bother him too much. Since his last visit he had an upper endoscopy with me on October 9 showing large esophageal varices and portal hypertensive gastropathy, he was thus started on atenolol 40 mg once a day. He is tolerating a low very well as well as his other medications. He is not having any lower extremity edema and he has not needed any further paracentesis. He is continuing a low-sodium diet. He has not had any alcohol since August 7 and is adamant he is not drinking any alcohol. He is otherwise had an iron deficiency anemia and has yet to schedule his colonoscopy to evaluate this. He denies any blood in stools and bowel habit changes. Otherwise in general he's feeling significantly improved compared to August and denies any other complaints today.   EGD done 01/09/2016 showing esophageal varices, small hiatal hernia, portal hypertensive gastropathy - biposies negative for HP    Past Medical History:  Diagnosis Date  . Alcohol use disorder ([place])   . Anemia   . Anxiety   . Arrhythmia   . Ascites due to alcoholic cirrhosis ([place])   . Blood transfusion without reported diagnosis   . Cirrhosis ([place])   . Depression   . Esophageal varices ([place])   . Family history of prostate cancer   . Substance abuse    ETOH  . Tobacco dependence      Past Surgical History:  Procedure Laterality Date  . APPENDECTOMY  1981  . HAND SURGERY Left 2013  . HERNIA REPAIR  1990   Family  History  Problem Relation Age of Onset  . Hypertension Mother   . Hypertension Father   . Prostate cancer Father   . Alcohol abuse Father   . Mental illness Father   . Heart disease Maternal Grandmother   . Stroke Maternal Grandfather   . Diabetes Maternal Grandfather   . Diabetes Paternal Grandmother   . Drug abuse Maternal Uncle   . Stroke Maternal Uncle   . Prostate cancer Maternal Uncle   . Colon cancer Neg Hx   . Esophageal cancer Neg Hx   . Rectal cancer Neg Hx   . Stomach cancer Neg Hx    Social History  Substance Use Topics  . Smoking status: Current Every Day Smoker    Packs/day: 1.00    Years: 25.00    Types: Cigarettes  . Smokeless tobacco: Never Used  . Alcohol use No     Comment: quit drinking 11/07/15   Current Outpatient Prescriptions  Medication Sig Dispense Refill  . buPROPion (WELLBUTRIN XL) 300 MG 24 hr tablet Take 1 tablet (300 mg total) by mouth daily. 30 tablet 2  . feeding supplement (BOOST HIGH PROTEIN) LIQD Take 1 Container by mouth every morning.    . ferrous sulfate 325 (65 FE) MG tablet Take 1 tablet (325 mg total) by mouth 2 (two) times daily with a meal. 60 tablet 3  . furosemide (LASIX) 40 MG tablet Take  1 tablet (40 mg total) by mouth daily. For morning swelling in legs and abdomen 30 tablet 6  . lactulose (CHRONULAC) 10 GM/15ML solution Take 30 mLs (20 g total) by mouth daily as needed for mild constipation. 240 mL 6  . Multiple Vitamin (MULTIVITAMIN WITH MINERALS) TABS tablet Take 1 tablet by mouth daily.    . nadolol (CORGARD) 40 MG tablet Take 1 tablet (40 mg total) by mouth daily. 90 tablet 3  . omeprazole (PRILOSEC) 20 MG capsule Take 20 mg by mouth daily as needed (acid reflux/).     Marland Kitchen spironolactone (ALDACTONE) 50 MG tablet Take 2 tab each morninig. 60 tablet 6  . Na Sulfate-K Sulfate-Mg Sulf 17.5-3.13-1.6 GM/180ML SOLN Take 1 kit by mouth once. 354 mL 0   Current Facility-Administered Medications  Medication Dose Route Frequency  Provider Last Rate Last Dose  . 0.9 %  sodium chloride infusion  500 mL Intravenous Continuous Manus Gunning, MD       No Known Allergies   Review of Systems: All systems reviewed and negative except where noted in HPI.   Lab Results  Component Value Date   WBC 12.3 (H) 12/07/2015   HGB 10.3 (L) 12/07/2015   HCT 29.7 (L) 12/07/2015   MCV 93.7 12/07/2015   PLT 318.0 12/07/2015    Lab Results  Component Value Date   CREATININE 0.90 12/22/2015   BUN 8 12/22/2015   NA 128 (L) 12/22/2015   K 3.5 12/22/2015   CL 92 (L) 12/22/2015   CO2 31 12/22/2015    Lab Results  Component Value Date   ALT 26 12/07/2015   AST 78 (H) 12/07/2015   ALKPHOS 118 (H) 12/07/2015   BILITOT 2.9 (H) 12/07/2015    Lab Results  Component Value Date   INR 1.6 (H) 12/07/2015   INR 1.53 11/27/2015   INR 1.63 11/26/2015      Physical Exam: BP 110/70   Pulse 80   Ht '5\' 10"'$  (1.778 m)   Wt 129 lb 4 oz (58.6 kg)   BMI 18.55 kg/m  Constitutional: Pleasant, thin, male in no acute distress. HEENT: Normocephalic and atraumatic. Conjunctivae are normal.. Neck supple.  Cardiovascular: Normal rate, regular rhythm.  Pulmonary/chest: Effort normal and breath sounds normal. No wheezing, rales or rhonchi. Abdominal: Soft, mildly distended, nontender.  There are no masses palpable. No hepatomegaly. Extremities: no edema Lymphadenopathy: No cervical adenopathy noted. Neurological: Alert and oriented to person place and time. No asterixis Skin: Skin is warm and dry. No rashes noted. Psychiatric: Normal mood and affect. Behavior is normal.   ASSESSMENT AND PLAN: 45 year old male with relatively new diagnosis of decompensated alcoholic cirrhosis, complicated by ascites and esophageal varices. His last meld score September was 16. He is adamant he has not had any alcohol since August.  Overall I do believe his liver disease is secondary to alcohol, although given his young age rescreen him for all  chronic liver diseases. His ceruloplasmin is slightly low, thus we'll send for a 24-hour urine copper to ensure normal. I will also send total IgG level, to complete workup for autoimmune hepatitis which I think is less likely. Finally we need to test his immunity to hepatitis A and B and vaccinated him if he is in need of this. Otherwise we'll get current baseline labs are him an update his meld score, and he will continue alcohol abstinence..  I discussed cirrhosis with him, natural history, risks of decompensation and development of liver cancer. I will  see what is up-to-date meld is, he may warrant evaluation by transplant hepatology. Otherwise he is improved his ascites is significantly less and he appears to be tolerating diuretics well as well as the nadolol.   Finally given his iron deficiency his EGD showed portal hypertensive gastropathy which could contribute to this, although he is in need of a colonoscopy to complete his evaluation. I discussed risks benefits of endoscopy with length, he wished to schedule and proceed with this. We will otherwise plan on [facility] screening every 6 months, next due in February  Rockwall Cellar, MD [facility] Pager [phone]

## 2016-01-23 NOTE — Patient Instructions (Signed)
If you are age 47 or older, your body mass index should be between 23-30. Your Body mass index is 18.55 kg/m. If this is out of the aforementioned range listed, please consider follow up with your Primary Care Provider.  If you are age 31 or younger, your body mass index should be between 19-25. Your Body mass index is 18.55 kg/m. If this is out of the aformentioned range listed, please consider follow up with your Primary Care Provider.   You have been scheduled for a colonoscopy. Please follow written instructions given to you at your visit today.  Please pick up your prep supplies at the pharmacy within the next 1-3 days. If you use inhalers (even only as needed), please bring them with you on the day of your procedure. Your physician has requested that you go to www.startemmi.com and enter the access code given to you at your visit today. This web site gives a general overview about your procedure. However, you should still follow specific instructions given to you by our office regarding your preparation for the procedure.  Your physician has requested that you go to the basement for lab work before leaving today.

## 2016-01-24 ENCOUNTER — Encounter: Payer: Self-pay | Admitting: Gastroenterology

## 2016-01-24 LAB — IGG: IGG (IMMUNOGLOBIN G), SERUM: 1711 mg/dL — AB (ref 694–1618)

## 2016-01-24 LAB — HEPATITIS A ANTIBODY, TOTAL: Hep A Total Ab: NONREACTIVE

## 2016-01-24 LAB — HEPATITIS B SURFACE ANTIBODY,QUALITATIVE: Hep B S Ab: NEGATIVE

## 2016-01-26 ENCOUNTER — Ambulatory Visit: Payer: BLUE CROSS/BLUE SHIELD | Admitting: Internal Medicine

## 2016-01-27 NOTE — Progress Notes (Signed)
Letter mailed. jf 

## 2016-01-30 ENCOUNTER — Other Ambulatory Visit: Payer: BLUE CROSS/BLUE SHIELD

## 2016-01-30 ENCOUNTER — Other Ambulatory Visit: Payer: Self-pay | Admitting: Gastroenterology

## 2016-01-30 DIAGNOSIS — R188 Other ascites: Secondary | ICD-10-CM

## 2016-01-30 DIAGNOSIS — I8500 Esophageal varices without bleeding: Secondary | ICD-10-CM

## 2016-01-30 DIAGNOSIS — D509 Iron deficiency anemia, unspecified: Secondary | ICD-10-CM

## 2016-01-30 DIAGNOSIS — K7460 Unspecified cirrhosis of liver: Secondary | ICD-10-CM

## 2016-01-31 ENCOUNTER — Telehealth: Payer: Self-pay | Admitting: Gastroenterology

## 2016-01-31 NOTE — Telephone Encounter (Signed)
Spoke to patient and he would like to get the Hepatitis A and B at his primary care physicians office which is closer to where he lives.

## 2016-02-04 ENCOUNTER — Telehealth: Payer: Self-pay | Admitting: Internal Medicine

## 2016-02-04 NOTE — Telephone Encounter (Signed)
Patient's Suprep was not called in. Has colonoscopy Monday. I called it in

## 2016-02-06 ENCOUNTER — Ambulatory Visit (AMBULATORY_SURGERY_CENTER): Payer: BLUE CROSS/BLUE SHIELD | Admitting: Gastroenterology

## 2016-02-06 ENCOUNTER — Encounter: Payer: Self-pay | Admitting: Gastroenterology

## 2016-02-06 VITALS — BP 109/63 | HR 60 | Temp 98.6°F | Resp 13 | Ht 70.0 in | Wt 129.0 lb

## 2016-02-06 DIAGNOSIS — D125 Benign neoplasm of sigmoid colon: Secondary | ICD-10-CM | POA: Diagnosis not present

## 2016-02-06 DIAGNOSIS — K7031 Alcoholic cirrhosis of liver with ascites: Secondary | ICD-10-CM

## 2016-02-06 DIAGNOSIS — D509 Iron deficiency anemia, unspecified: Secondary | ICD-10-CM

## 2016-02-06 DIAGNOSIS — D124 Benign neoplasm of descending colon: Secondary | ICD-10-CM | POA: Diagnosis not present

## 2016-02-06 MED ORDER — SPIRONOLACTONE 50 MG PO TABS: ORAL_TABLET | ORAL | 6 refills | Status: DC

## 2016-02-06 MED ORDER — SODIUM CHLORIDE 0.9 % IV SOLN: 500.0000 mL | INTRAVENOUS | Status: DC

## 2016-02-06 NOTE — Progress Notes (Signed)
Pt said he drank a few sips of water up until 12:00.  Slso pt reported his last BM was light brownish colored liquid.  Rush Barer, RN reported this info to Holland Falling, CRNa and Dr. Kenmore Cellar.  No orders. maw

## 2016-02-06 NOTE — Patient Instructions (Addendum)
Impression/Recommendations:  Polyp handout given to patient. Hemorrhoid handout given to patient.  Decrease Spironolactone to one 50 mg. tablet daily. No ibuprofen, naproxen, or other NSAIDS for 2 weeks.   Tylenol only for 2 weeks.  Repeat colonoscopy recommended for surveillance.  Date to be determined pending pathology results.  Repeat CBC in one month.  YOU HAD AN ENDOSCOPIC PROCEDURE TODAY AT [facility]:   Refer to the procedure report that was given to you for any specific questions about what was found during the examination.  If the procedure report does not answer your questions, please call your gastroenterologist to clarify.  If you requested that your care partner not be given the details of your procedure findings, then the procedure report has been included in a sealed envelope for you to review at your convenience later.  YOU SHOULD EXPECT: Some feelings of bloating in the abdomen. Passage of more gas than usual.  Walking can help get rid of the air that was put into your GI tract during the procedure and reduce the bloating. If you had a lower endoscopy (such as a colonoscopy or flexible sigmoidoscopy) you may notice spotting of blood in your stool or on the toilet paper. If you underwent a bowel prep for your procedure, you may not have a normal bowel movement for a few days.  Please Note:  You might notice some irritation and congestion in your nose or some drainage.  This is from the oxygen used during your procedure.  There is no need for concern and it should clear up in a day or so.  SYMPTOMS TO REPORT IMMEDIATELY:   Following lower endoscopy (colonoscopy or flexible sigmoidoscopy):  Excessive amounts of blood in the stool  Significant tenderness or worsening of abdominal pains  Swelling of the abdomen that is new, acute  Fever of 100F or higher For urgent or emergent issues, a gastroenterologist can be reached at any hour by calling [phone]  [phone].   DIET:  We do recommend a small meal at first, but then you may proceed to your regular diet.  Drink plenty of fluids but you should avoid alcoholic beverages for 24 hours.  ACTIVITY:  You should plan to take it easy for the rest of today and you should NOT DRIVE or use heavy machinery until tomorrow (because of the sedation medicines used during the test).    FOLLOW UP: Our staff will call the number listed on your records the next business day following your procedure to check on you and address any questions or concerns that you may have regarding the information given to you following your procedure. If we do not reach you, we will leave a message.  However, if you are feeling well and you are not experiencing any problems, there is no need to return our call.  We will assume that you have returned to your regular daily activities without incident.  If any biopsies were taken you will be contacted by phone or by letter within the next 1-3 weeks.  Please call us at [phone] if you have not heard about the biopsies in 3 weeks.    SIGNATURES/CONFIDENTIALITY: You and/or your care partner have signed paperwork which will be entered into your electronic medical record.  These signatures attest to the fact that that the information above on your After Visit Summary has been reviewed and is understood.  Full responsibility of the confidentiality of this discharge information lies with you and/or your care-partner.

## 2016-02-06 NOTE — Progress Notes (Signed)
To recovery, report to Sechler, RN, VSS 

## 2016-02-06 NOTE — Op Note (Signed)
[place] Patient Name: Todd Higgins Procedure Date: 02/06/2016 1:56 PM MRN: [id number] Endoscopist: Remo Lipps P. Armbruster MD, MD Age: 45 Referring MD:  Date of Birth: December 14, 1970 Gender: Male Account #: [id number] Procedure:                Colonoscopy Indications:              Unexplained iron deficiency anemia, this is the                            patient's first colonoscopy Medicines:                Monitored Anesthesia Care Procedure:                Pre-Anesthesia Assessment:                           - Prior to the procedure, a History and Physical                            was performed, and patient medications and                            allergies were reviewed. The patient's tolerance of                            previous anesthesia was also reviewed. The risks                            and benefits of the procedure and the sedation                            options and risks were discussed with the patient.                            All questions were answered, and informed consent                            was obtained. Prior Anticoagulants: The patient has                            taken no previous anticoagulant or antiplatelet                            agents. ASA Grade Assessment: III - A patient with                            severe systemic disease. After reviewing the risks                            and benefits, the patient was deemed in                            satisfactory condition to undergo the procedure.  After obtaining informed consent, the colonoscope                            was passed under direct vision. Throughout the                            procedure, the patient's blood pressure, pulse, and                            oxygen saturations were monitored continuously. The                            Model PCF-H190L [id number]) scope was introduced                            through the anus and  advanced to the the terminal                            ileum, with identification of the appendiceal                            orifice and IC valve. The colonoscopy was performed                            without difficulty. The patient tolerated the                            procedure well. The quality of the bowel                            preparation was good. The terminal ileum, ileocecal                            valve, appendiceal orifice, and rectum were                            photographed. Scope In: 2:00:35 PM Scope Out: 2:16:15 PM Scope Withdrawal Time: 0 hours 13 minutes 17 seconds  Total Procedure Duration: 0 hours 15 minutes 40 seconds  Findings:                 The perianal and digital rectal examinations were                            normal.                           A 7 mm polyp was found in the descending colon. The                            polyp was sessile. The polyp was removed with a                            cold snare. Resection and retrieval were complete.  A 4 mm polyp was found in the sigmoid colon. The                            polyp was sessile. The polyp was removed with a                            cold snare. Resection and retrieval were complete.                           The terminal ileum appeared normal.                           Internal hemorrhoids were found during retroflexion.                           The exam was otherwise without abnormality. Complications:            No immediate complications. Estimated blood loss:                            Minimal. Estimated Blood Loss:     Estimated blood loss was minimal. Impression:               - One 7 mm polyp in the descending colon, removed                            with a cold snare. Resected and retrieved.                           - One 4 mm polyp in the sigmoid colon, removed with                            a cold snare. Resected and retrieved.                            - The examined portion of the ileum was normal.                           - Internal hemorrhoids.                           - The examination was otherwise normal.                           Overall, no clear source for anemia on this exam,                            which may otherwise be due to portal hypertensive                            gastropathy. Recommendation:           - Patient has a contact number available for  emergencies. The signs and symptoms of potential                            delayed complications were discussed with the                            patient. Return to normal activities tomorrow.                            Written discharge instructions were provided to the                            patient.                           - Resume previous diet.                           - Continue present medications.                           - No ibuprofen, naproxen, or other non-steroidal                            anti-inflammatory drugs for 2 weeks after polyp                            removal.                           - Await pathology results.                           - Repeat colonoscopy is recommended for                            surveillance. The colonoscopy date will be                            determined after pathology results from today's                            exam become available for review.                           - Continue iron supplementation                           - Repeat CBC in 1 month Steven P. Armbruster MD, MD 02/06/2016 2:21:31 PM This report has been signed electronically.

## 2016-02-06 NOTE — Progress Notes (Signed)
Called to procedure room for pathology.

## 2016-02-07 ENCOUNTER — Telehealth: Payer: Self-pay | Admitting: *Deleted

## 2016-02-07 NOTE — Telephone Encounter (Signed)
  Follow up Call-  Call back number 02/06/2016 01/09/2016  Post procedure Call Back phone  # [phone] cell [phone] cell  Permission to leave phone message Yes Yes  Some recent data might be hidden     Patient questions:  Do you have a fever, pain , or abdominal swelling? No. Pain Score  0 *  Have you tolerated food without any problems? Yes.    Have you been able to return to your normal activities? Yes.    Do you have any questions about your discharge instructions: Diet   No. Medications  No. Follow up visit  No.  Do you have questions or concerns about your Care? No.  Actions: * If pain score is 4 or above: No action needed, pain <4.

## 2016-02-09 ENCOUNTER — Encounter: Payer: Self-pay | Admitting: Gastroenterology

## 2016-02-09 LAB — COPPER, URINE, 24 HOUR

## 2016-02-10 ENCOUNTER — Other Ambulatory Visit: Payer: Self-pay

## 2016-02-10 DIAGNOSIS — K7031 Alcoholic cirrhosis of liver with ascites: Secondary | ICD-10-CM

## 2016-02-13 DIAGNOSIS — R188 Other ascites: Secondary | ICD-10-CM | POA: Diagnosis not present

## 2016-02-13 DIAGNOSIS — K7460 Unspecified cirrhosis of liver: Secondary | ICD-10-CM | POA: Diagnosis not present

## 2016-02-13 DIAGNOSIS — D509 Iron deficiency anemia, unspecified: Secondary | ICD-10-CM | POA: Diagnosis not present

## 2016-02-13 DIAGNOSIS — I8500 Esophageal varices without bleeding: Secondary | ICD-10-CM | POA: Diagnosis not present

## 2016-02-13 LAB — COPPER, URINE, 24 HOUR
CREATININE, URINE MG/DAY-CUURI: 1.05 g/(24.h) (ref 0.63–2.50)
Copper,Urine (24 Hr): 6 mcg/L (ref 2–30)

## 2016-02-16 ENCOUNTER — Other Ambulatory Visit: Payer: Self-pay

## 2016-02-16 DIAGNOSIS — K7460 Unspecified cirrhosis of liver: Secondary | ICD-10-CM

## 2016-02-27 ENCOUNTER — Ambulatory Visit (INDEPENDENT_AMBULATORY_CARE_PROVIDER_SITE_OTHER): Payer: BLUE CROSS/BLUE SHIELD | Admitting: Internal Medicine

## 2016-02-27 ENCOUNTER — Encounter: Payer: Self-pay | Admitting: Internal Medicine

## 2016-02-27 VITALS — BP 104/70 | HR 57 | Temp 98.3°F | Wt 132.0 lb

## 2016-02-27 DIAGNOSIS — Z23 Encounter for immunization: Secondary | ICD-10-CM

## 2016-02-27 DIAGNOSIS — K7030 Alcoholic cirrhosis of liver without ascites: Secondary | ICD-10-CM | POA: Diagnosis not present

## 2016-02-27 DIAGNOSIS — F39 Unspecified mood [affective] disorder: Secondary | ICD-10-CM

## 2016-02-27 DIAGNOSIS — F1099 Alcohol use, unspecified with unspecified alcohol-induced disorder: Secondary | ICD-10-CM | POA: Diagnosis not present

## 2016-02-27 DIAGNOSIS — H524 Presbyopia: Secondary | ICD-10-CM | POA: Diagnosis not present

## 2016-02-27 DIAGNOSIS — IMO0002 Reserved for concepts with insufficient information to code with codable children: Secondary | ICD-10-CM

## 2016-02-27 NOTE — Progress Notes (Signed)
Subjective:    Patient ID: Todd Higgins, male    DOB: Jul 13, 1970, 45 y.o.   MRN: [id number]  HPI Here for follow up of alcohol use disorder and cirrhosis  Over 3 months sober Goes to [employer] meetings intermittently Hasn't kept up with sponsor--didn't find it helpful "No urge to drink" Keeps up socially with family  Still some anxiety Doesn't have the pervasive fear of dying so prominently Still taking the bupropion---he can't tell it is working  Back to working full schedule  Current Outpatient Prescriptions on File Prior to Visit  Medication Sig Dispense Refill  . buPROPion (WELLBUTRIN XL) 300 MG 24 hr tablet Take 1 tablet (300 mg total) by mouth daily. 30 tablet 2  . feeding supplement (BOOST HIGH PROTEIN) LIQD Take 1 Container by mouth every morning.    . ferrous sulfate 325 (65 FE) MG tablet Take 1 tablet (325 mg total) by mouth 2 (two) times daily with a meal. 60 tablet 3  . furosemide (LASIX) 40 MG tablet Take 1 tablet (40 mg total) by mouth daily. For morning swelling in legs and abdomen 30 tablet 6  . Multiple Vitamin (MULTIVITAMIN WITH MINERALS) TABS tablet Take 1 tablet by mouth daily.    . nadolol (CORGARD) 40 MG tablet Take 1 tablet (40 mg total) by mouth daily. 90 tablet 3  . omeprazole (PRILOSEC) 20 MG capsule Take 20 mg by mouth daily as needed (acid reflux/).     Marland Kitchen spironolactone (ALDACTONE) 50 MG tablet Take 1 tab each morninig. 60 tablet 6   Current Facility-Administered Medications on File Prior to Visit  Medication Dose Route Frequency Provider Last Rate Last Dose  . 0.9 %  sodium chloride infusion  500 mL Intravenous Continuous Todd Gunning, MD      . 0.9 %  sodium chloride infusion  500 mL Intravenous Continuous Todd Gunning, MD        No Known Allergies  Past Medical History:  Diagnosis Date  . Alcohol use disorder ([place])   . Anemia   . Anxiety   . Arrhythmia   . Ascites due to alcoholic cirrhosis ([place])   . Blood transfusion  without reported diagnosis   . Cirrhosis ([place])   . Depression   . Esophageal varices ([place])   . Family history of prostate cancer   . Substance abuse    ETOH  . Tobacco dependence     Past Surgical History:  Procedure Laterality Date  . APPENDECTOMY  1981  . HAND SURGERY Left 2013  . HERNIA REPAIR  1990  . UPPER GASTROINTESTINAL ENDOSCOPY      Family History  Problem Relation Age of Onset  . Hypertension Mother   . Hypertension Father   . Prostate cancer Father   . Alcohol abuse Father   . Mental illness Father   . Heart disease Maternal Grandmother   . Stroke Maternal Grandfather   . Diabetes Maternal Grandfather   . Diabetes Paternal Grandmother   . Drug abuse Maternal Uncle   . Stroke Maternal Uncle   . Prostate cancer Maternal Uncle   . Colon cancer Neg Hx   . Esophageal cancer Neg Hx   . Rectal cancer Neg Hx   . Stomach cancer Neg Hx     Social History   Social History  . Marital status: Single    Spouse name: N/A  . Number of children: 0  . Years of education: N/A   Occupational History  . 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 Smoking status: Current Every Day Smoker    Packs/day: 1.00    Years: 25.00    Types: Cigarettes  . Smokeless tobacco: Never Used  . Alcohol use No     Comment: quit drinking 11/07/15  . Drug use: No  . Sexual activity: Not on file   Other Topics Concern  . Not on file   Social History Narrative  . No narrative on file   Review of Systems  Still has trouble falling asleep and staying asleep Mind races at times Not tired during the day Appetite is good Weight up slightly No abdominal pain Some nipple soreness and hardness--some better with reduced spironolactone dose (but persists)     Objective:   Physical Exam  Constitutional: He appears well-nourished. No distress.  Psychiatric: He has a normal mood and affect. His behavior is normal.          Assessment &  Plan:

## 2016-02-27 NOTE — Assessment & Plan Note (Signed)
Seems stabilized Continues with Dr Havery Moros May be able to wean off the diuretics over time

## 2016-02-27 NOTE — Assessment & Plan Note (Signed)
Anxiety is better Will continue the bupropion

## 2016-02-27 NOTE — Assessment & Plan Note (Signed)
Now abstinent nearly 4 months Not really active with AA Discussed vigilance

## 2016-02-27 NOTE — Progress Notes (Signed)
Pre visit review using our clinic review tool, if applicable. No additional management support is needed unless otherwise documented below in the visit note. 

## 2016-03-03 ENCOUNTER — Other Ambulatory Visit: Payer: Self-pay | Admitting: Internal Medicine

## 2016-09-10 ENCOUNTER — Ambulatory Visit (INDEPENDENT_AMBULATORY_CARE_PROVIDER_SITE_OTHER): Payer: BLUE CROSS/BLUE SHIELD | Admitting: Internal Medicine

## 2016-09-10 ENCOUNTER — Encounter: Payer: Self-pay | Admitting: Internal Medicine

## 2016-09-10 VITALS — BP 132/82 | HR 68 | Temp 97.8°F | Ht 68.5 in | Wt 140.0 lb

## 2016-09-10 DIAGNOSIS — IMO0002 Reserved for concepts with insufficient information to code with codable children: Secondary | ICD-10-CM

## 2016-09-10 DIAGNOSIS — K7030 Alcoholic cirrhosis of liver without ascites: Secondary | ICD-10-CM | POA: Diagnosis not present

## 2016-09-10 DIAGNOSIS — Z0000 Encounter for general adult medical examination without abnormal findings: Secondary | ICD-10-CM | POA: Insufficient documentation

## 2016-09-10 DIAGNOSIS — Z23 Encounter for immunization: Secondary | ICD-10-CM | POA: Diagnosis not present

## 2016-09-10 DIAGNOSIS — F1099 Alcohol use, unspecified with unspecified alcohol-induced disorder: Secondary | ICD-10-CM

## 2016-09-10 NOTE — Addendum Note (Signed)
Addended by: Pilar Grammes on: 09/10/2016 04:50 PM   Modules accepted: Orders

## 2016-09-10 NOTE — Progress Notes (Signed)
Subjective:    Patient ID: Todd Higgins, male    DOB: 04/27/1970, 46 y.o.   MRN: [id number]  HPI Here for physical  Feels great Getting close to 1 year of sobriety AA not for him---and didn't get anything out of his sponsor No thoughts of any drinking Discussed concerns for denial and relapse  Stopped all the medications Was having nipple pain No apparent fluid in abdomen or feet Stopped bupropion soon after my last visit---has done well  Current Outpatient Prescriptions on File Prior to Visit  Medication Sig Dispense Refill  . feeding supplement (BOOST HIGH PROTEIN) LIQD Take 1 Container by mouth every morning.    . Multiple Vitamin (MULTIVITAMIN WITH MINERALS) TABS tablet Take 1 tablet by mouth daily.     Current Facility-Administered Medications on File Prior to Visit  Medication Dose Route Frequency Provider Last Rate Last Dose  . 0.9 %  sodium chloride infusion  500 mL Intravenous Continuous Armbruster, Renelda Loma, MD      . 0.9 %  sodium chloride infusion  500 mL Intravenous Continuous Armbruster, Renelda Loma, MD        No Known Allergies  Past Medical History:  Diagnosis Date  . Alcohol use disorder ([place])   . Anemia   . Anxiety   . Arrhythmia   . Ascites due to alcoholic cirrhosis ([place])   . Blood transfusion without reported diagnosis   . Cirrhosis ([place])   . Depression   . Esophageal varices ([place])   . Family history of prostate cancer   . Substance abuse    ETOH  . Tobacco dependence     Past Surgical History:  Procedure Laterality Date  . APPENDECTOMY  1981  . HAND SURGERY Left 2013  . HERNIA REPAIR  1990  . UPPER GASTROINTESTINAL ENDOSCOPY      Family History  Problem Relation Age of Onset  . Hypertension Mother   . Hypertension Father   . Prostate cancer Father   . Alcohol abuse Father   . Mental illness Father   . Heart disease Maternal Grandmother   . Stroke Maternal Grandfather   . Diabetes Maternal Grandfather   . Diabetes Paternal  Grandmother   . Drug abuse Maternal Uncle   . Stroke Maternal Uncle   . Prostate cancer Maternal Uncle   . Colon cancer Neg Hx   . Esophageal cancer Neg Hx   . Rectal cancer Neg Hx   . Stomach cancer Neg Hx     Social History   Social History  . Marital status: Single    Spouse name: N/A  . Number of children: 0  . Years of education: N/A   Occupational History  . 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 Smoking status: Current Every Day Smoker    Packs/day: 1.00    Years: 25.00    Types: Cigarettes  . Smokeless tobacco: Never Used  . Alcohol use No     Comment: quit drinking 11/07/15  . Drug use: No  . Sexual activity: Not on file   Other Topics Concern  . Not on file   Social History Narrative  . No narrative on file   Review of Systems  Constitutional: Negative for fatigue.       Has gained back some weight Wears seat belt Still smoking--not ready yet to quit  HENT: Negative for dental problem, hearing loss and tinnitus.  Keeps up with dentist  Eyes: Positive for visual disturbance.       No diplopia or unilateral vision loss Fuzziness in right eye  Respiratory: Positive for cough. Negative for chest tightness and shortness of breath.   Cardiovascular: Negative for chest pain, palpitations and leg swelling.  Gastrointestinal: Negative for abdominal distention, abdominal pain, blood in stool and constipation.  Endocrine: Negative for polydipsia and polyuria.  Genitourinary: Negative for difficulty urinating and urgency.       No sexual problems  Musculoskeletal: Negative for arthralgias, back pain and joint swelling.  Skin:       Slight rash by ankles---scaly  Allergic/Immunologic: Negative for environmental allergies and immunocompromised state.  Neurological: Negative for dizziness, syncope, light-headedness and headaches.  Hematological: Negative for adenopathy. Does not bruise/bleed easily.    Psychiatric/Behavioral: Positive for sleep disturbance. Negative for dysphoric mood. The patient is not nervous/anxious.        Trouble initiating sleep--but then does okay       Objective:   Physical Exam  Constitutional: He appears well-developed and well-nourished. No distress.  HENT:  Head: Normocephalic and atraumatic.  Right Ear: External ear normal.  Left Ear: External ear normal.  Mouth/Throat: Oropharynx is clear and moist. No oropharyngeal exudate.  Eyes: Conjunctivae are normal. Pupils are equal, round, and reactive to light.  Neck: Normal range of motion. Neck supple. No thyromegaly present.  Cardiovascular: Normal rate, regular rhythm, normal heart sounds and intact distal pulses.  Exam reveals no gallop.   No murmur heard. Pulmonary/Chest: Effort normal and breath sounds normal. No respiratory distress. He has no wheezes. He has no rales.  Abdominal: Soft. There is no tenderness.  Musculoskeletal: He exhibits no edema or tenderness.  Lymphadenopathy:    He has no cervical adenopathy.  Skin: No erythema.  Slight scaling at ankle  Psychiatric: He has a normal mood and affect. His behavior is normal.          Assessment & Plan:

## 2016-09-10 NOTE — Assessment & Plan Note (Signed)
Looks great No ascites Will check ultrasound for cancer screeening Hep A/B vaccine

## 2016-09-10 NOTE — Assessment & Plan Note (Signed)
Has been abstinent about 10 months

## 2016-09-10 NOTE — Assessment & Plan Note (Signed)
Doing well Had colon Consider PSA at 53 Not ready to stop smoking--discussed

## 2016-09-12 NOTE — Addendum Note (Signed)
Addended by: Marchia Bond on: 09/12/2016 04:12 PM   Modules accepted: Orders

## 2016-09-17 ENCOUNTER — Other Ambulatory Visit: Payer: BLUE CROSS/BLUE SHIELD

## 2016-09-18 ENCOUNTER — Ambulatory Visit
Admission: RE | Admit: 2016-09-18 | Discharge: 2016-09-18 | Disposition: A | Discharge status: Home / Self Care | Payer: BLUE CROSS/BLUE SHIELD | Source: Ambulatory Visit | Attending: Internal Medicine | Admitting: Internal Medicine

## 2016-09-18 DIAGNOSIS — K760 Fatty (change of) liver, not elsewhere classified: Secondary | ICD-10-CM | POA: Diagnosis not present

## 2016-09-18 DIAGNOSIS — K8020 Calculus of gallbladder without cholecystitis without obstruction: Secondary | ICD-10-CM | POA: Diagnosis not present

## 2016-09-18 DIAGNOSIS — K7030 Alcoholic cirrhosis of liver without ascites: Secondary | ICD-10-CM | POA: Diagnosis present

## 2016-09-18 DIAGNOSIS — K7460 Unspecified cirrhosis of liver: Secondary | ICD-10-CM | POA: Diagnosis not present

## 2016-11-13 ENCOUNTER — Ambulatory Visit: Payer: BLUE CROSS/BLUE SHIELD

## 2017-01-22 DIAGNOSIS — H25813 Combined forms of age-related cataract, bilateral: Secondary | ICD-10-CM | POA: Diagnosis not present

## 2017-03-19 ENCOUNTER — Ambulatory Visit: Payer: BLUE CROSS/BLUE SHIELD

## 2017-07-12 IMAGING — US US PARACENTESIS
1 series · 8 of 8 positions shown · non-contrast
Comparison: none

INDICATION: Alcoholic cirrhosis, ascites; request made for diagnostic and
therapeutic paracentesis.

[Series 1: us paracentesis · 0.26mm/px · 8 of 8 slices shown]
[im 1/8]
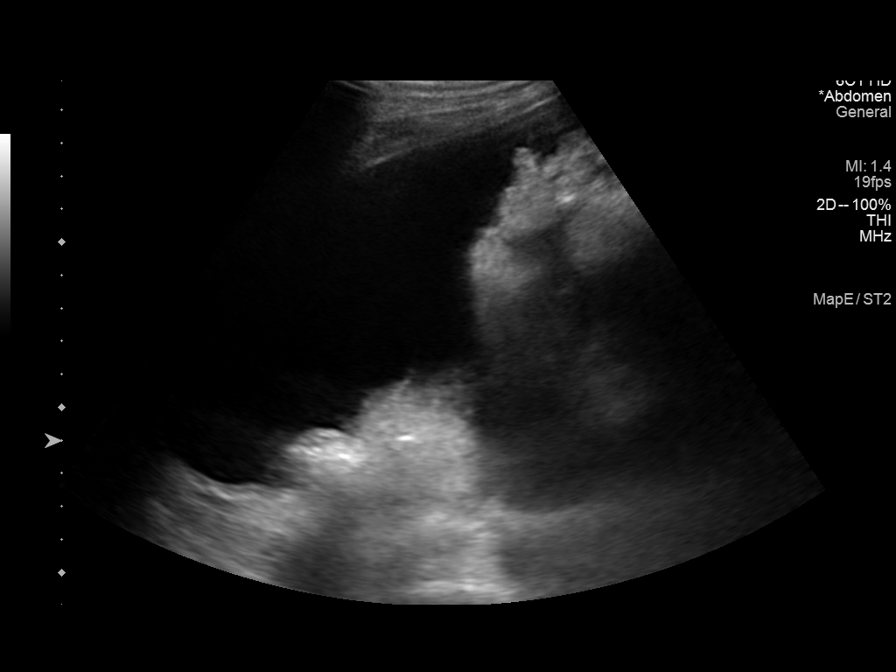
[im 2/8]
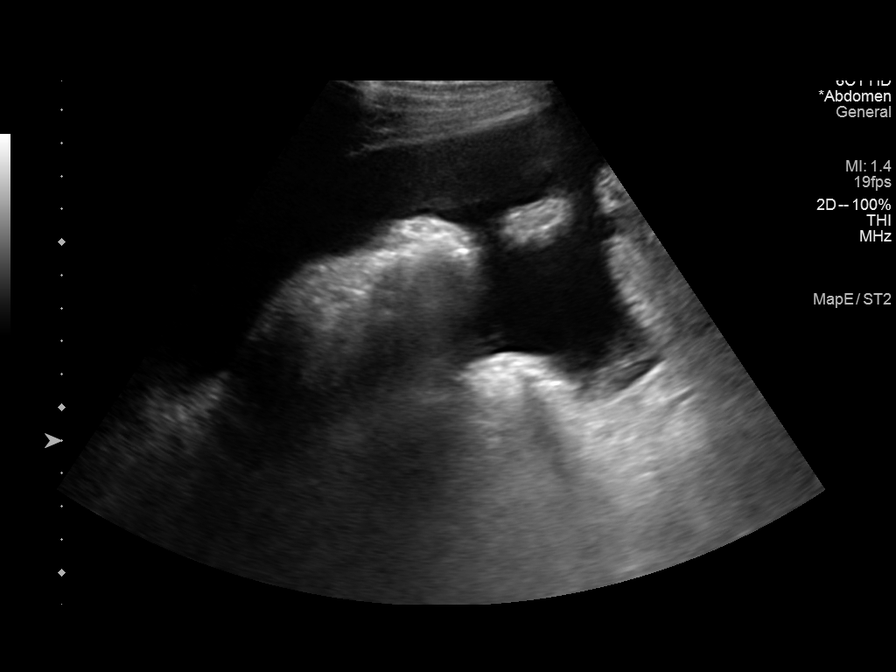
[im 3/8]
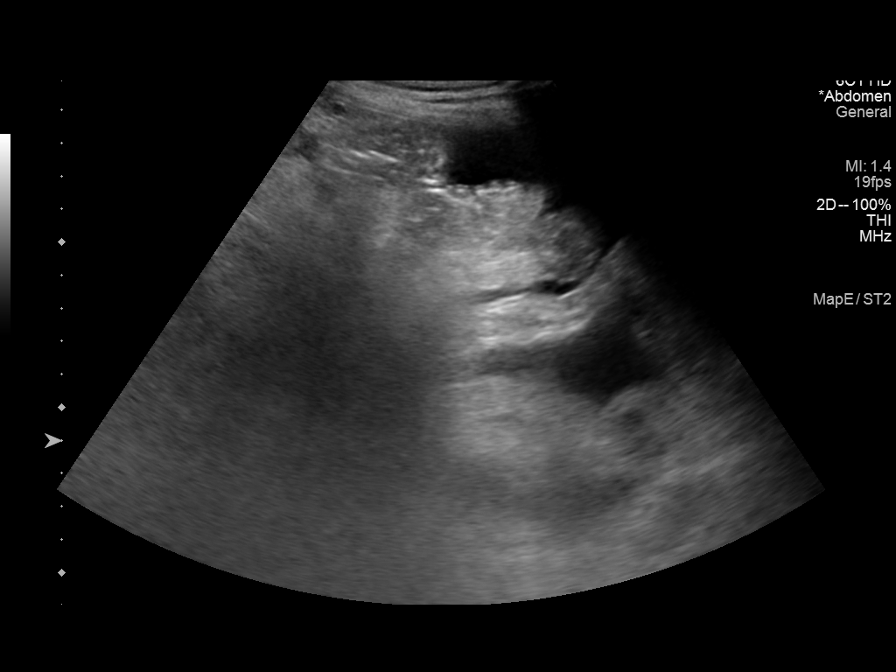
[im 4/8]
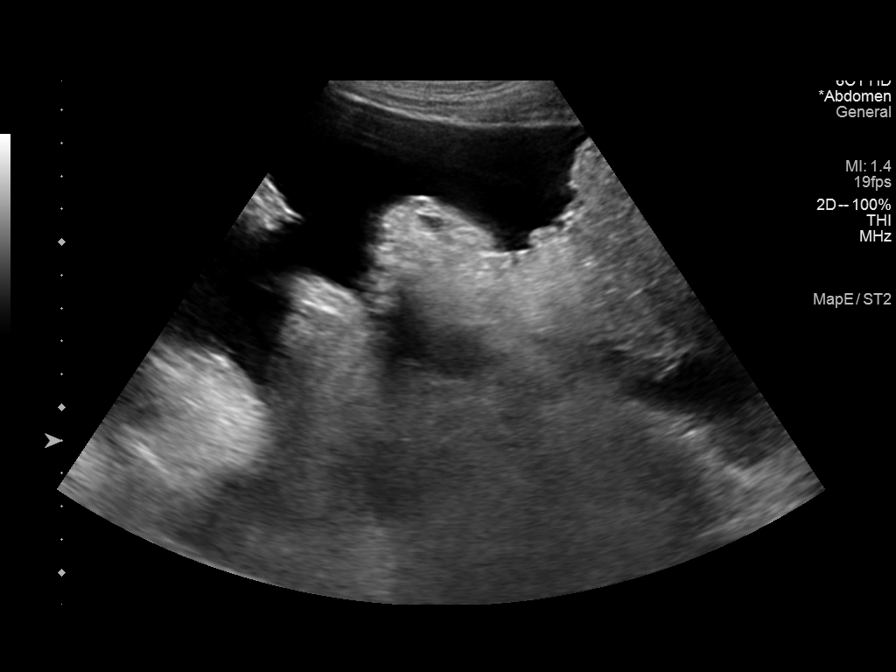
[im 5/8]
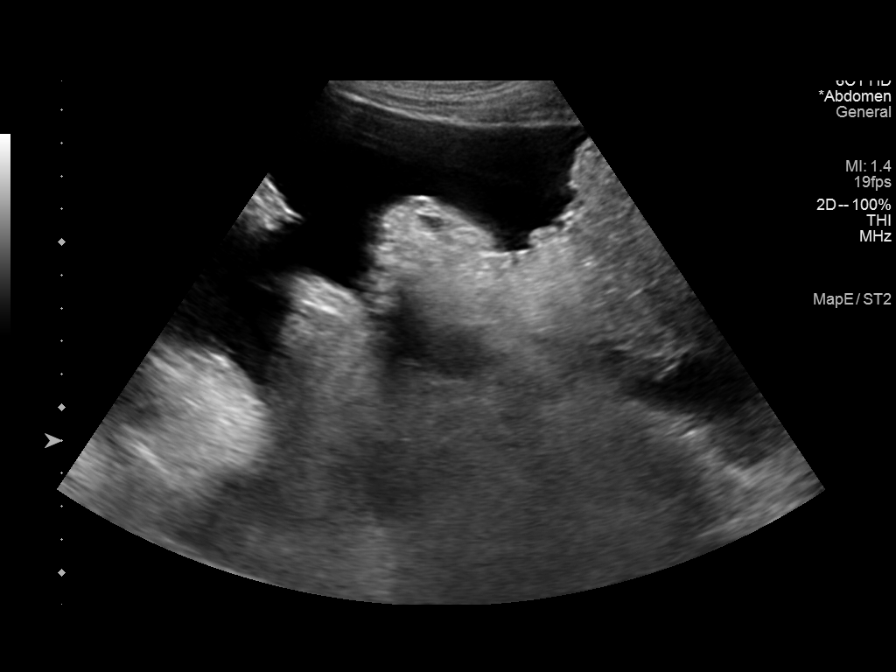
[im 6/8]
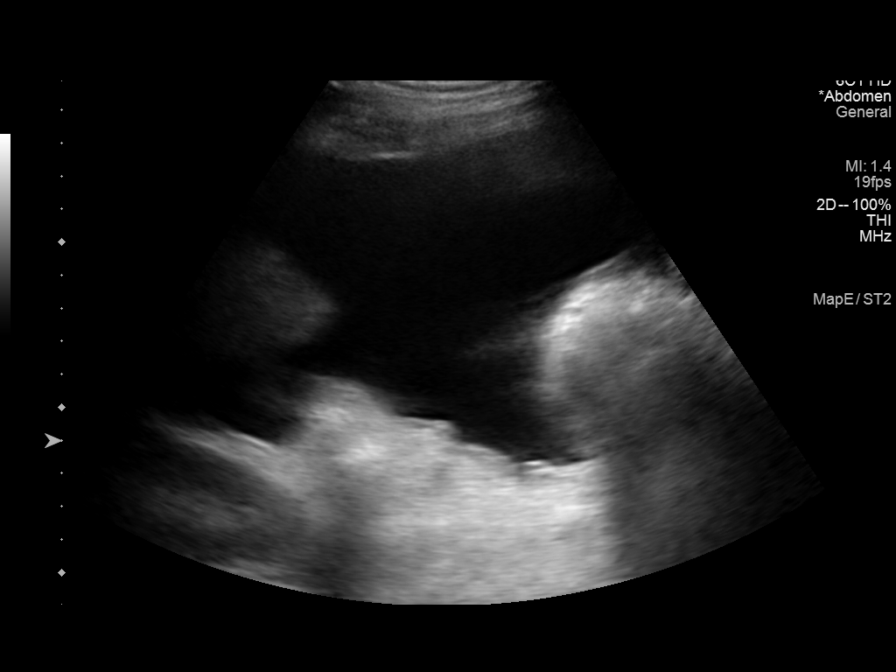
[im 7/8]
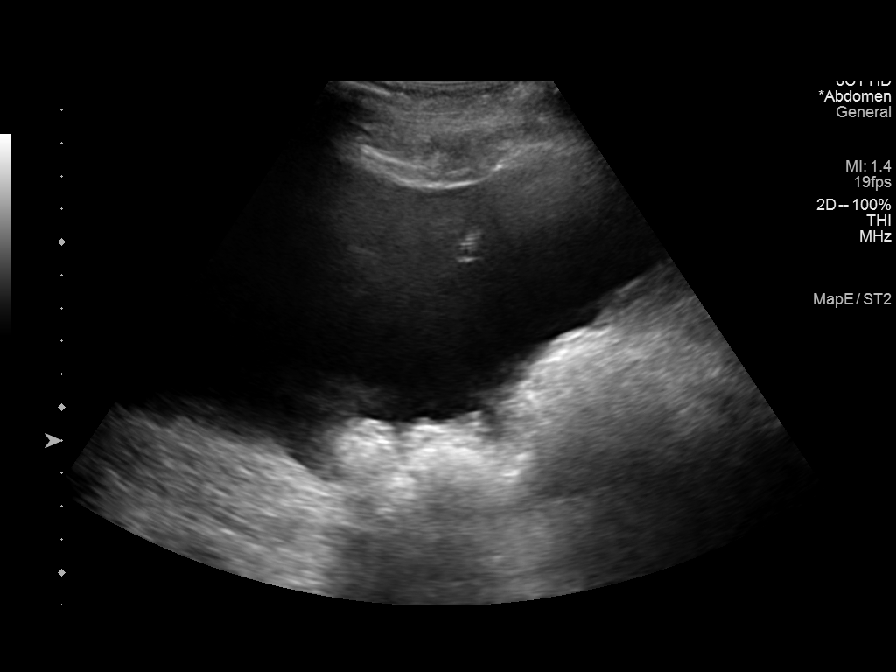
[im 8/8]
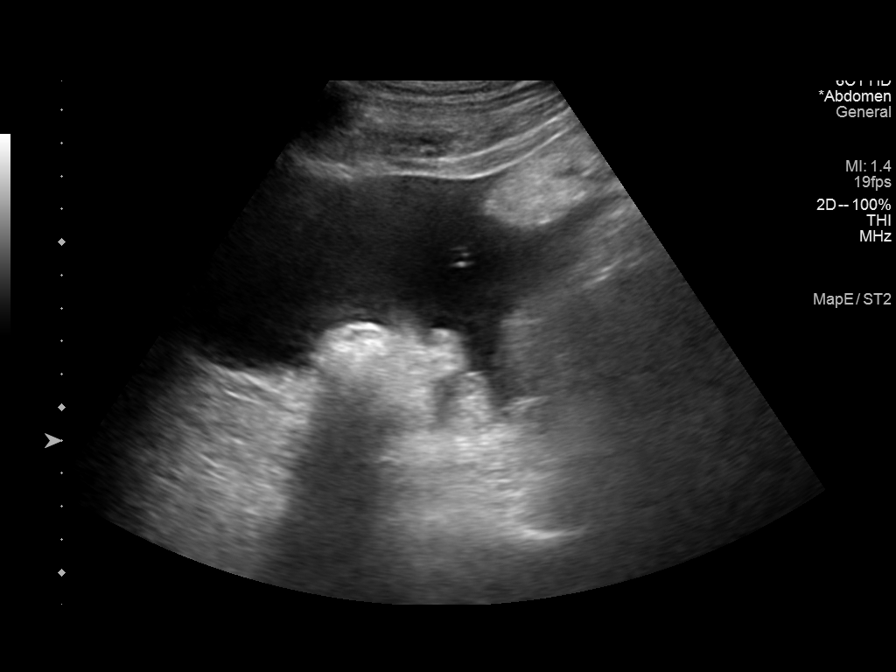

[8 of 8 positions shown; findings below may reference images not displayed]

EXAM:
ULTRASOUND GUIDED DIAGNOSTIC AND THERAPEUTIC PARACENTESIS

MEDICATIONS:
None.

COMPLICATIONS:
None immediate.

PROCEDURE:
Informed written consent was obtained from the patient after a
discussion of the risks, benefits and alternatives to treatment. A
timeout was performed prior to the initiation of the procedure.

Initial ultrasound scanning demonstrates a large amount of ascites
within the right mid to lower abdominal quadrant. The right mid to
lower abdomen was prepped and draped in the usual sterile fashion.
1% lidocaine was used for local anesthesia.

Following this, a Yueh catheter was introduced. An ultrasound image
was saved for documentation purposes. The paracentesis was
performed. The catheter was removed and a dressing was applied. The
patient tolerated the procedure well without immediate post
procedural complication.
FINDINGS: A total of approximately 4 liters of clear, yellow fluid was
removed. A portion of the fluid was submitted to the lab for
preordered studies. Since this was the patient's initial
paracentesis only the above amount of fluid was removed at this
time.
IMPRESSION: Successful ultrasound-guided diagnostic and therapeutic paracentesis
yielding 4 liters of peritoneal fluid.

## 2017-09-17 ENCOUNTER — Telehealth: Payer: Self-pay

## 2017-09-17 NOTE — Telephone Encounter (Signed)
Spoke to pt. He said he is doing really well. No issues. He will call the office soon to schedule a CPE.

## 2017-09-18 NOTE — Telephone Encounter (Signed)
Glad to hear he is doing well Will see him at the appt when he sets up

## 2017-12-10 ENCOUNTER — Encounter: Payer: Self-pay | Admitting: Internal Medicine

## 2017-12-10 ENCOUNTER — Ambulatory Visit: Payer: BLUE CROSS/BLUE SHIELD | Admitting: Internal Medicine

## 2017-12-10 ENCOUNTER — Telehealth: Payer: Self-pay | Admitting: Internal Medicine

## 2017-12-10 ENCOUNTER — Telehealth: Payer: Self-pay | Admitting: Radiology

## 2017-12-10 VITALS — BP 122/78 | HR 82 | Temp 99.4°F | Resp 14 | Ht 68.5 in | Wt 128.0 lb

## 2017-12-10 DIAGNOSIS — R918 Other nonspecific abnormal finding of lung field: Secondary | ICD-10-CM | POA: Diagnosis not present

## 2017-12-10 DIAGNOSIS — R0602 Shortness of breath: Secondary | ICD-10-CM | POA: Diagnosis not present

## 2017-12-10 DIAGNOSIS — R911 Solitary pulmonary nodule: Secondary | ICD-10-CM | POA: Diagnosis not present

## 2017-12-10 DIAGNOSIS — J181 Lobar pneumonia, unspecified organism: Secondary | ICD-10-CM | POA: Insufficient documentation

## 2017-12-10 LAB — CBC
HCT: 39.2 % (ref 39.0–52.0)
HEMOGLOBIN: 13.3 g/dL (ref 13.0–17.0)
MCHC: 34 g/dL (ref 30.0–36.0)
MCV: 94.6 fl (ref 78.0–100.0)
PLATELETS: 380 10*3/uL (ref 150.0–400.0)
RBC: 4.14 Mil/uL — ABNORMAL LOW (ref 4.22–5.81)
RDW: 13.2 % (ref 11.5–15.5)

## 2017-12-10 LAB — COMPREHENSIVE METABOLIC PANEL
ALBUMIN: 4 g/dL (ref 3.5–5.2)
ALK PHOS: 90 U/L (ref 39–117)
ALT: 18 U/L (ref 0–53)
AST: 19 U/L (ref 0–37)
BILIRUBIN TOTAL: 1.6 mg/dL — AB (ref 0.2–1.2)
BUN: 11 mg/dL (ref 6–23)
CO2: 29 mEq/L (ref 19–32)
Calcium: 9.6 mg/dL (ref 8.4–10.5)
Chloride: 93 mEq/L — ABNORMAL LOW (ref 96–112)
Creatinine, Ser: 0.82 mg/dL (ref 0.40–1.50)
GFR: 105.9 mL/min (ref >60.00)
GLUCOSE: 107 mg/dL — AB (ref 70–99)
POTASSIUM: 3.5 meq/L (ref 3.5–5.1)
Sodium: 132 mEq/L — ABNORMAL LOW (ref 135–145)
TOTAL PROTEIN: 8.4 g/dL — AB (ref 6.0–8.3)

## 2017-12-10 MED ORDER — AMOXICILLIN-POT CLAVULANATE 875-125 MG PO TABS: 1.0000 | ORAL_TABLET | Freq: Two times a day (BID) | ORAL | 0 refills | Status: DC

## 2017-12-10 MED ORDER — LEVOFLOXACIN 500 MG PO TABS: 500.0000 mg | ORAL_TABLET | Freq: Every day | ORAL | 0 refills | Status: AC

## 2017-12-10 MED ORDER — CEFTRIAXONE SODIUM 1 G IJ SOLR
1.0000 g | Freq: Once | INTRAMUSCULAR | Status: AC
  Administered 2017-12-10: 1 g via INTRAMUSCULAR

## 2017-12-10 NOTE — Telephone Encounter (Signed)
Pt and pts mother came to office; pt still having SOB and CP on rt side; UC did not give abx or any treatment just advised pt to go to ED. Dr Silvio Pate said since pt is here to add on to his schedule. Pt appreciative. FYI to Dr Silvio Pate. Larene Beach CMA has copy of xray report from UC earlier today.

## 2017-12-10 NOTE — Addendum Note (Signed)
Addended by: Pilar Grammes on: 12/10/2017 01:15 PM   Modules accepted: Orders

## 2017-12-10 NOTE — Telephone Encounter (Signed)
See note and orders Hopefully this is just pneumonia

## 2017-12-10 NOTE — Assessment & Plan Note (Addendum)
Partial cavitation Right side pleural effusion also with pleuritic pain Acute onset Suggests pneumonia No history of aspiration  Will treat with rocephin here levaquin and augmentin (to cover anaerobes) at home Chest CT to further evaluate ER if worsens

## 2017-12-10 NOTE — Progress Notes (Signed)
Subjective:    Patient ID: Todd Higgins, male    DOB: 06-Mar-1971, 47 y.o.   MRN: [id number]  HPI Here with mom  Started with SOB and some right chest pain----"like a pulled muscle in my shoulder" Stabbing sensation with deep breath---for 3 days Breathing issues also mostly for 3 days Cold chills--not sure about fever Not much cough---"but I would like to cough, feels like I have fluid in my lungs" Some yellow green phlegm with rare cough The pleuritic pain is worse now with lying down  Remains abstinent Working in the shop Still smoking--stopped 3 days ago with illness  Current Outpatient Medications on File Prior to Visit  Medication Sig Dispense Refill  . feeding supplement (BOOST HIGH PROTEIN) LIQD Take 1 Container by mouth every morning.    . Multiple Vitamin (MULTIVITAMIN WITH MINERALS) TABS tablet Take 1 tablet by mouth daily.     No current facility-administered medications on file prior to visit.     No Known Allergies  Past Medical History:  Diagnosis Date  . Alcohol use disorder   . Anemia   . Anxiety   . Arrhythmia   . Ascites due to alcoholic cirrhosis ([place])   . Blood transfusion without reported diagnosis   . Cirrhosis ([place])   . Depression   . Esophageal varices ([place])   . Family history of prostate cancer   . Substance abuse ([place])    ETOH  . Tobacco dependence     Past Surgical History:  Procedure Laterality Date  . APPENDECTOMY  1981  . HAND SURGERY Left 2013  . HERNIA REPAIR  1990  . UPPER GASTROINTESTINAL ENDOSCOPY      Family History  Problem Relation Age of Onset  . Hypertension Mother   . Hypertension Father   . Prostate cancer Father   . Alcohol abuse Father   . Mental illness Father   . Heart disease Maternal Grandmother   . Stroke Maternal Grandfather   . Diabetes Maternal Grandfather   . Diabetes Paternal Grandmother   . Drug abuse Maternal Uncle   . Stroke Maternal Uncle   . Prostate cancer Maternal Uncle   . Colon cancer  Neg Hx   . Esophageal cancer Neg Hx   . Rectal cancer Neg Hx   . Stomach cancer Neg Hx     Social History   Socioeconomic History  . Marital status: Single    Spouse name: Not on file  . Number of children: 0  . Years of education: Not on file  . Highest education level: Not on file  Occupational History  . Occupation: Press photographer, service    Comment: [employer]--family business  Social Needs  . Financial resource strain: Not on file  . Food insecurity:    Worry: Not on file    Inability: Not on file  . Transportation needs:    Medical: Not on file    Non-medical: Not on file  Tobacco Use  . Smoking status: Current Every Day Smoker    Packs/day: 1.00    Years: 25.00    Pack years: 25.00    Types: Cigarettes  . Smokeless tobacco: Never Used  Substance and Sexual Activity  . Alcohol use: No    Comment: quit drinking 11/07/15  . Drug use: No  . Sexual activity: Not on file  Lifestyle  . Physical activity:    Days per week: Not on file    Minutes per session: Not on file  . Stress:  Not on file  Relationships  . Social connections:    Talks on phone: Not on file    Gets together: Not on file    Attends religious service: Not on file    Active member of club or organization: Not on file    Attends meetings of clubs or organizations: Not on file    Relationship status: Not on file  . Intimate partner violence:    Fear of current or ex partner: Not on file    Emotionally abused: Not on file    Physically abused: Not on file    Forced sexual activity: Not on file  Other Topics Concern  . Not on file  Social History Narrative  . Not on file   Review of Systems Cold symptoms ~2 weeks ago Appetite is off Has lost weight again    Objective:   Physical Exam  Constitutional: No distress.  Neck: No thyromegaly present.  Cardiovascular: Normal rate, regular rhythm and normal heart sounds. Exam reveals no gallop.  No murmur heard. Respiratory: Effort normal. He has  no wheezes. He has no rales.  Dullness and decreased breath sounds at right base No tenderness but sig pleuritic pain with any deep breathing  Lymphadenopathy:    He has no cervical adenopathy.           Assessment & Plan:

## 2017-12-10 NOTE — Telephone Encounter (Signed)
Pt currently being treated with antibiotics ( rocephin in office today as well as oral augmentin and levo) for presumed pneumonia.

## 2017-12-10 NOTE — Telephone Encounter (Signed)
Copied from [place] [id number]. Topic: Quick Communication - See Telephone Encounter >> Dec 10, 2017 10:31 AM Rutherford Nail, NT wrote: CRM for notification. See Telephone encounter for: 12/10/17. Patient's mother, Romie Minus, calling and states that the patient was having trouble breathing this morning and went to an urgent care. They did a chest x-ray and found a 5cm mass in the lower right lobe and recommended he go to the ER. Patient's mother would like to know what Dr Silvio Pate wants him to do. CB#: [phone]

## 2017-12-10 NOTE — Telephone Encounter (Signed)
[facility] called critical results, WBC - 18.1. Results given to Dr Diona Browner

## 2017-12-11 ENCOUNTER — Ambulatory Visit
Admission: RE | Admit: 2017-12-11 | Discharge: 2017-12-11 | Disposition: A | Discharge status: Home / Self Care | Payer: BLUE CROSS/BLUE SHIELD | Source: Ambulatory Visit | Attending: Internal Medicine | Admitting: Internal Medicine

## 2017-12-11 DIAGNOSIS — F17210 Nicotine dependence, cigarettes, uncomplicated: Secondary | ICD-10-CM | POA: Insufficient documentation

## 2017-12-11 DIAGNOSIS — R911 Solitary pulmonary nodule: Secondary | ICD-10-CM | POA: Diagnosis not present

## 2017-12-11 DIAGNOSIS — K769 Liver disease, unspecified: Secondary | ICD-10-CM | POA: Insufficient documentation

## 2017-12-11 DIAGNOSIS — J90 Pleural effusion, not elsewhere classified: Secondary | ICD-10-CM | POA: Insufficient documentation

## 2017-12-11 DIAGNOSIS — J850 Gangrene and necrosis of lung: Secondary | ICD-10-CM | POA: Diagnosis not present

## 2017-12-11 DIAGNOSIS — R918 Other nonspecific abnormal finding of lung field: Secondary | ICD-10-CM | POA: Diagnosis not present

## 2017-12-11 MED ORDER — IOHEXOL 300 MG/ML  SOLN
75.0000 mL | Freq: Once | INTRAMUSCULAR | Status: AC | PRN
  Administered 2017-12-11: 75 mL via INTRAVENOUS

## 2017-12-11 NOTE — Telephone Encounter (Signed)
I will call him with the results

## 2017-12-11 NOTE — Telephone Encounter (Signed)
This is certainly not unexpected

## 2017-12-11 NOTE — Telephone Encounter (Signed)
Angel from [facility] called and stated that chest CT has been read. Please advise

## 2017-12-16 ENCOUNTER — Ambulatory Visit: Payer: BLUE CROSS/BLUE SHIELD | Admitting: Internal Medicine

## 2017-12-16 ENCOUNTER — Encounter: Payer: Self-pay | Admitting: Internal Medicine

## 2017-12-16 VITALS — BP 104/60 | HR 84 | Temp 98.2°F | Resp 16 | Ht 69.0 in | Wt 129.0 lb

## 2017-12-16 DIAGNOSIS — J181 Lobar pneumonia, unspecified organism: Secondary | ICD-10-CM | POA: Diagnosis not present

## 2017-12-16 MED ORDER — AMOXICILLIN-POT CLAVULANATE 875-125 MG PO TABS: 1.0000 | ORAL_TABLET | Freq: Two times a day (BID) | ORAL | 0 refills | Status: AC

## 2017-12-16 NOTE — Progress Notes (Signed)
Subjective:    Patient ID: Todd Higgins, male    DOB: [birth year], 47 y.o.   MRN: [id number]  HPI Here for follow up of pneumonia With mom  Still having bad pleuritic pain a couple of days after the visit Especially with lying down Feels the fluid is finally better Some cough--especially with deep breath Some pain in upper chest--- wonders about muscular (discussed referred diaphragm symptoms)  No fevers Has soaked his bed with sweat nightly for a while (even before the illness)  Current Outpatient Medications on File Prior to Visit  Medication Sig Dispense Refill  . amoxicillin-clavulanate (AUGMENTIN) 875-125 MG tablet Take 1 tablet by mouth 2 (two) times daily. 14 tablet 0  . feeding supplement (BOOST HIGH PROTEIN) LIQD Take 1 Container by mouth every morning.    Marland Kitchen levofloxacin (LEVAQUIN) 500 MG tablet Take 1 tablet (500 mg total) by mouth daily. 10 tablet 0  . Multiple Vitamin (MULTIVITAMIN WITH MINERALS) TABS tablet Take 1 tablet by mouth daily.     No current facility-administered medications on file prior to visit.     No Known Allergies  Past Medical History:  Diagnosis Date  . Alcohol use disorder   . Anemia   . Anxiety   . Arrhythmia   . Ascites due to alcoholic cirrhosis ([place])   . Blood transfusion without reported diagnosis   . Cirrhosis ([place])   . Depression   . Esophageal varices ([place])   . Family history of prostate cancer   . Substance abuse ([place])    ETOH  . Tobacco dependence     Past Surgical History:  Procedure Laterality Date  . APPENDECTOMY  1981  . HAND SURGERY Left 2013  . HERNIA REPAIR  1990  . UPPER GASTROINTESTINAL ENDOSCOPY      Family History  Problem Relation Age of Onset  . Hypertension Mother   . Hypertension Father   . Prostate cancer Father   . Alcohol abuse Father   . Mental illness Father   . Heart disease Maternal Grandmother   . Stroke Maternal Grandfather   . Diabetes Maternal Grandfather   . Diabetes Paternal  Grandmother   . Drug abuse Maternal Uncle   . Stroke Maternal Uncle   . Prostate cancer Maternal Uncle   . Colon cancer Neg Hx   . Esophageal cancer Neg Hx   . Rectal cancer Neg Hx   . Stomach cancer Neg Hx     Social History   Socioeconomic History  . Marital status: Single    Spouse name: Not on file  . Number of children: 0  . Years of education: Not on file  . Highest education level: Not on file  Occupational History  . Occupation: Press photographer, service    Comment: [employer]--family business  Social Needs  . Financial resource strain: Not on file  . Food insecurity:    Worry: Not on file    Inability: Not on file  . Transportation needs:    Medical: Not on file    Non-medical: Not on file  Tobacco Use  . Smoking status: Current Every Day Smoker    Packs/day: 1.00    Years: 25.00    Pack years: 25.00    Types: Cigarettes  . Smokeless tobacco: Never Used  Substance and Sexual Activity  . Alcohol use: No    Comment: quit drinking 11/07/15  . Drug use: No  . Sexual activity: Not on file  Lifestyle  . Physical activity:  Days per week: Not on file    Minutes per session: Not on file  . Stress: Not on file  Relationships  . Social connections:    Talks on phone: Not on file    Gets together: Not on file    Attends religious service: Not on file    Active member of club or organization: Not on file    Attends meetings of clubs or organizations: Not on file    Relationship status: Not on file  . Intimate partner violence:    Fear of current or ex partner: Not on file    Emotionally abused: Not on file    Physically abused: Not on file    Forced sexual activity: Not on file  Other Topics Concern  . Not on file  Social History Narrative  . Not on file   Review of Systems Appetite is improving Weight up 1# No problems with the meds Has had some irritation and swelling of left Achilles---goes back some months (not wearing support shoes)    Objective:    Physical Exam  Constitutional: No distress.  Appears well today  Respiratory: Effort normal. No respiratory distress. He has no wheezes. He has no rales.  Dullness and decreased breath sounds at right base---otherwise clear           Assessment & Plan:

## 2017-12-16 NOTE — Assessment & Plan Note (Signed)
CT reassuring that this is infection Necrotizing mass---so will extend the augmentin for anaerobic coverage Some increase in his Achilles pain---so will stop the levaquin Will recheck in 1 month and recheck CXR then

## 2018-01-15 ENCOUNTER — Ambulatory Visit: Payer: BLUE CROSS/BLUE SHIELD | Admitting: Internal Medicine

## 2018-01-15 DIAGNOSIS — Z0289 Encounter for other administrative examinations: Secondary | ICD-10-CM

## 2018-12-20 ENCOUNTER — Other Ambulatory Visit: Payer: Self-pay

## 2018-12-20 ENCOUNTER — Emergency Department: Payer: BC Managed Care – PPO

## 2018-12-20 ENCOUNTER — Encounter: Payer: Self-pay | Admitting: Emergency Medicine

## 2018-12-20 ENCOUNTER — Emergency Department
Admission: EM | Admit: 2018-12-20 | Discharge: 2018-12-20 | Disposition: A | Discharge status: Home / Self Care | Payer: BC Managed Care – PPO | Attending: Emergency Medicine | Admitting: Emergency Medicine

## 2018-12-20 DIAGNOSIS — Y9289 Other specified places as the place of occurrence of the external cause: Secondary | ICD-10-CM | POA: Insufficient documentation

## 2018-12-20 DIAGNOSIS — Y9389 Activity, other specified: Secondary | ICD-10-CM | POA: Diagnosis not present

## 2018-12-20 DIAGNOSIS — Z87891 Personal history of nicotine dependence: Secondary | ICD-10-CM | POA: Insufficient documentation

## 2018-12-20 DIAGNOSIS — Y990 Civilian activity done for income or pay: Secondary | ICD-10-CM | POA: Diagnosis not present

## 2018-12-20 DIAGNOSIS — S92314A Nondisplaced fracture of first metatarsal bone, right foot, initial encounter for closed fracture: Secondary | ICD-10-CM | POA: Insufficient documentation

## 2018-12-20 DIAGNOSIS — S99921A Unspecified injury of right foot, initial encounter: Secondary | ICD-10-CM | POA: Diagnosis not present

## 2018-12-20 DIAGNOSIS — W208XXA Other cause of strike by thrown, projected or falling object, initial encounter: Secondary | ICD-10-CM | POA: Insufficient documentation

## 2018-12-20 DIAGNOSIS — S92301A Fracture of unspecified metatarsal bone(s), right foot, initial encounter for closed fracture: Secondary | ICD-10-CM

## 2018-12-20 DIAGNOSIS — S92311A Displaced fracture of first metatarsal bone, right foot, initial encounter for closed fracture: Secondary | ICD-10-CM | POA: Diagnosis not present

## 2018-12-20 MED ORDER — OXYCODONE-ACETAMINOPHEN 5-325 MG PO TABS
1.0000 | ORAL_TABLET | Freq: Once | ORAL | Status: AC
  Administered 2018-12-20: 1 via ORAL
  Filled 2018-12-20: qty 1

## 2018-12-20 MED ORDER — IBUPROFEN 600 MG PO TABS
600.0000 mg | ORAL_TABLET | Freq: Once | ORAL | Status: AC
  Administered 2018-12-20: 600 mg via ORAL
  Filled 2018-12-20: qty 1

## 2018-12-20 MED ORDER — IBUPROFEN 600 MG PO TABS: 600.0000 mg | ORAL_TABLET | Freq: Three times a day (TID) | ORAL | 0 refills | Status: AC | PRN

## 2018-12-20 MED ORDER — OXYCODONE-ACETAMINOPHEN 7.5-325 MG PO TABS: 1.0000 | ORAL_TABLET | Freq: Four times a day (QID) | ORAL | 0 refills | Status: AC | PRN

## 2018-12-20 NOTE — ED Notes (Signed)
PT states this is a worker's comp claim.  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  Pt declines drug screen at this time.
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
## 2018-12-20 NOTE — ED Provider Notes (Signed)
[facility] Emergency Department Provider Note   ____________________________________________   First MD Initiated Contact with Patient 12/20/18 1358     (approximate)  I have reviewed the triage vital signs and the nursing notes.   HISTORY  Chief Complaint Foot Injury    HPI Todd Higgins is a 48 y.o. male patient complain of pain to the right foot secondary to dropping a bite on the foot at work.  Incident occurred about an hour ago.  Patient pain increased with weightbearing.  Pain seems to be concentrated in the right great toe.  Patient rates pain as a 3/10.  Patient described the pain as "achy".  No palliative measure for complaint.         Past Medical History:  Diagnosis Date  . Alcohol use disorder   . Anemia   . Anxiety   . Arrhythmia   . Ascites due to alcoholic cirrhosis ([place])   . Blood transfusion without reported diagnosis   . Cirrhosis ([place])   . Depression   . Esophageal varices ([place])   . Family history of prostate cancer   . Substance abuse ([place])    ETOH  . Tobacco dependence     Patient Active Problem List   Diagnosis Date Noted  . Lobar pneumonia ([place]) 12/10/2017  . Preventative health care 09/10/2016  . Cigarette smoker 12/15/2015  . Cirrhosis of liver ([place]) 11/14/2015  . Alcohol use disorder     Past Surgical History:  Procedure Laterality Date  . APPENDECTOMY  1981  . HAND SURGERY Left 2013  . HERNIA REPAIR  1990  . UPPER GASTROINTESTINAL ENDOSCOPY      Prior to Admission medications   Medication Sig Start Date End Date Taking? Authorizing Provider  amoxicillin-clavulanate (AUGMENTIN) 875-125 MG tablet Take 1 tablet by mouth 2 (two) times daily. 12/16/17   Venia Carbon, MD  feeding supplement (BOOST HIGH PROTEIN) LIQD Take 1 Container by mouth every morning.    [provider]  ibuprofen (ADVIL) 600 MG tablet Take 1 tablet (600 mg total) by mouth every 8 (eight) hours as needed. 12/20/18    Sable Feil, PA-C  levofloxacin (LEVAQUIN) 500 MG tablet Take 1 tablet (500 mg total) by mouth daily. 12/10/17   Venia Carbon, MD  Multiple Vitamin (MULTIVITAMIN WITH MINERALS) TABS tablet Take 1 tablet by mouth daily.    [provider]  oxyCODONE-acetaminophen (PERCOCET) 7.5-325 MG tablet Take 1 tablet by mouth every 6 (six) hours as needed. 12/20/18   Sable Feil, PA-C    Allergies Patient has no known allergies.  Family History  Problem Relation Age of Onset  . Hypertension Mother   . Hypertension Father   . Prostate cancer Father   . Alcohol abuse Father   . Mental illness Father   . Heart disease Maternal Grandmother   . Stroke Maternal Grandfather   . Diabetes Maternal Grandfather   . Diabetes Paternal Grandmother   . Drug abuse Maternal Uncle   . Stroke Maternal Uncle   . Prostate cancer Maternal Uncle   . Colon cancer Neg Hx   . Esophageal cancer Neg Hx   . Rectal cancer Neg Hx   . Stomach cancer Neg Hx     Social History Social History   Tobacco Use  . Smoking status: Former Smoker    Packs/day: 1.00    Years: 25.00    Pack years: 25.00    Types: Cigarettes    Quit date: 12/09/2017  Years since quitting: 1.0  . Smokeless tobacco: Current User    Types: Snuff  Substance Use Topics  . Alcohol use: No    Comment: quit drinking 11/07/15  . Drug use: No    Review of Systems Constitutional: No fever/chills Eyes: No visual changes. ENT: No sore throat. Cardiovascular: Denies chest pain. Respiratory: Denies shortness of breath. Gastrointestinal: No abdominal pain.  No nausea, no vomiting.  No diarrhea.  No constipation. Genitourinary: Negative for dysuria. Musculoskeletal: Negative for back pain. Skin: Negative for rash. Neurological: Negative for headaches, focal weakness or numbness. Psychiatric:  Anxiety and depression Endocrine:  Cirrhosis of the liver ____________________________________________   PHYSICAL EXAM:  VITAL SIGNS:  ED Triage Vitals  Enc Vitals Group     BP 12/20/18 1252 (!) 150/70     Pulse Rate 12/20/18 1252 (!) 56     Resp 12/20/18 1252 16     Temp 12/20/18 1252 97.8 F (36.6 C)     Temp Source 12/20/18 1252 Oral     SpO2 12/20/18 1252 100 %     Weight 12/20/18 1253 135 lb (61.2 kg)     Height 12/20/18 1253 5\' 10"  (1.778 m)     Head Circumference --      Peak Flow --      Pain Score 12/20/18 1253 3     Pain Loc --      Pain Edu? --      Excl. in [place]? --    Constitutional: Alert and oriented. Well appearing and in no acute distress. Cardiovascular: Normal rate, regular rhythm. Grossly normal heart sounds.  Good peripheral circulation. Respiratory: Normal respiratory effort.  No retractions. Lungs CTAB. Gastrointestinal: Soft and nontender. No distention. No abdominal bruits. No CVA tenderness. Musculoskeletal: No obvious deformity to the right foot.  Moderate guarding palpation at the first metatarsal head.  Neurologic:  Normal speech and language. No gross focal neurologic deficits are appreciated. No gait instability. Skin:  Skin is warm, dry and intact. No rash noted. Psychiatric: Mood and affect are normal. Speech and behavior are normal.  ____________________________________________   LABS (all labs ordered are listed, but only abnormal results are displayed)  Labs Reviewed - No data to display ____________________________________________  EKG   ____________________________________________  RADIOLOGY  ED MD interpretation:    Official radiology report(s): Dg Foot Complete Right  Result Date: 12/20/2018 CLINICAL DATA:  Pain, trauma EXAM: RIGHT FOOT COMPLETE - 3+ VIEW COMPARISON:  None. FINDINGS: There is a comminuted, angulated fracture of the distal right first metatarsal. No other acute fracture or dislocation. There is probable chronic sequelae of prior Lisfranc injury to the first and second metatarsal interval. Soft tissues are unremarkable. IMPRESSION: There is a  comminuted, angulated fracture of the distal right first metatarsal. Electronically Signed   By: Eddie Candle M.D.   On: 12/20/2018 15:14    ____________________________________________   PROCEDURES  Procedure(s) performed (including Critical Care):  Procedures   ____________________________________________   INITIAL IMPRESSION / ASSESSMENT AND PLAN / ED COURSE  As part of my medical decision making, I reviewed the following data within the [place] was evaluated in Emergency Department on 12/20/2018 for the symptoms described in the history of present illness. He was evaluated in the context of the global COVID-19 pandemic, which necessitated consideration that the patient might be at risk for infection with the SARS-CoV-2 virus that causes COVID-19. Institutional protocols and algorithms that pertain  to the evaluation of patients at risk for COVID-19 are in a state of rapid change based on information released by regulatory bodies including the CDC and federal and state organizations. These policies and algorithms were followed during the patient's care in the ED.     Right foot pain secondary to a metatarsal head fracture of the first digit right foot.  Discussed patient with Dr. Caryl Comes who will follow the patient up in his clinic.  Patient placed in a splint and given crutches to assist with ambulation.  Patient given a work note until clearance will be given by podiatry when he can return back to work.  Take medication as needed for pain.   ____________________________________________   FINAL CLINICAL IMPRESSION(S) / ED DIAGNOSES  Final diagnoses:  Closed fracture of metatarsal bone of right foot, physeal involvement unspecified, unspecified metatarsal, initial encounter     ED Discharge Orders         Ordered    oxyCODONE-acetaminophen (PERCOCET) 7.5-325 MG tablet  Every 6 hours PRN     12/20/18 1537    ibuprofen (ADVIL) 600 MG  tablet  Every 8 hours PRN     12/20/18 1537           Note:  This document was prepared using Dragon voice recognition software and may include unintentional dictation errors.    Sable Feil, PA-C 12/20/18 1539    Carrie Mew, MD 12/21/18 [phone]

## 2018-12-26 DIAGNOSIS — M79671 Pain in right foot: Secondary | ICD-10-CM | POA: Diagnosis not present

## 2018-12-26 DIAGNOSIS — M25474 Effusion, right foot: Secondary | ICD-10-CM | POA: Diagnosis not present

## 2018-12-26 DIAGNOSIS — S92311D Displaced fracture of first metatarsal bone, right foot, subsequent encounter for fracture with routine healing: Secondary | ICD-10-CM | POA: Diagnosis not present

## 2018-12-26 DIAGNOSIS — R262 Difficulty in walking, not elsewhere classified: Secondary | ICD-10-CM | POA: Diagnosis not present

## 2019-01-01 ENCOUNTER — Telehealth: Payer: Self-pay

## 2019-01-01 NOTE — Telephone Encounter (Signed)
Spoke to pt. Said his foot still hurts. Pt went to an orthopedic specialist. In a cam walker. Said he may need surgery but would rather note.

## 2019-01-09 DIAGNOSIS — M79671 Pain in right foot: Secondary | ICD-10-CM | POA: Diagnosis not present

## 2019-01-09 DIAGNOSIS — R262 Difficulty in walking, not elsewhere classified: Secondary | ICD-10-CM | POA: Diagnosis not present

## 2019-01-09 DIAGNOSIS — S92311D Displaced fracture of first metatarsal bone, right foot, subsequent encounter for fracture with routine healing: Secondary | ICD-10-CM | POA: Diagnosis not present

## 2019-01-09 DIAGNOSIS — M25474 Effusion, right foot: Secondary | ICD-10-CM | POA: Diagnosis not present

## 2019-05-12 DIAGNOSIS — R208 Other disturbances of skin sensation: Secondary | ICD-10-CM | POA: Diagnosis not present

## 2019-05-12 DIAGNOSIS — R238 Other skin changes: Secondary | ICD-10-CM | POA: Diagnosis not present

## 2019-05-12 DIAGNOSIS — L538 Other specified erythematous conditions: Secondary | ICD-10-CM | POA: Diagnosis not present

## 2019-05-12 DIAGNOSIS — L728 Other follicular cysts of the skin and subcutaneous tissue: Secondary | ICD-10-CM | POA: Diagnosis not present

## 2020-08-07 IMAGING — DX DG FOOT COMPLETE 3+V*R*
3 series · 3 of 3 positions shown · non-contrast
Comparison: None.

CLINICAL DATA: Pain, trauma

EXAM:
RIGHT FOOT COMPLETE - 3+ VIEW

[foot ap]
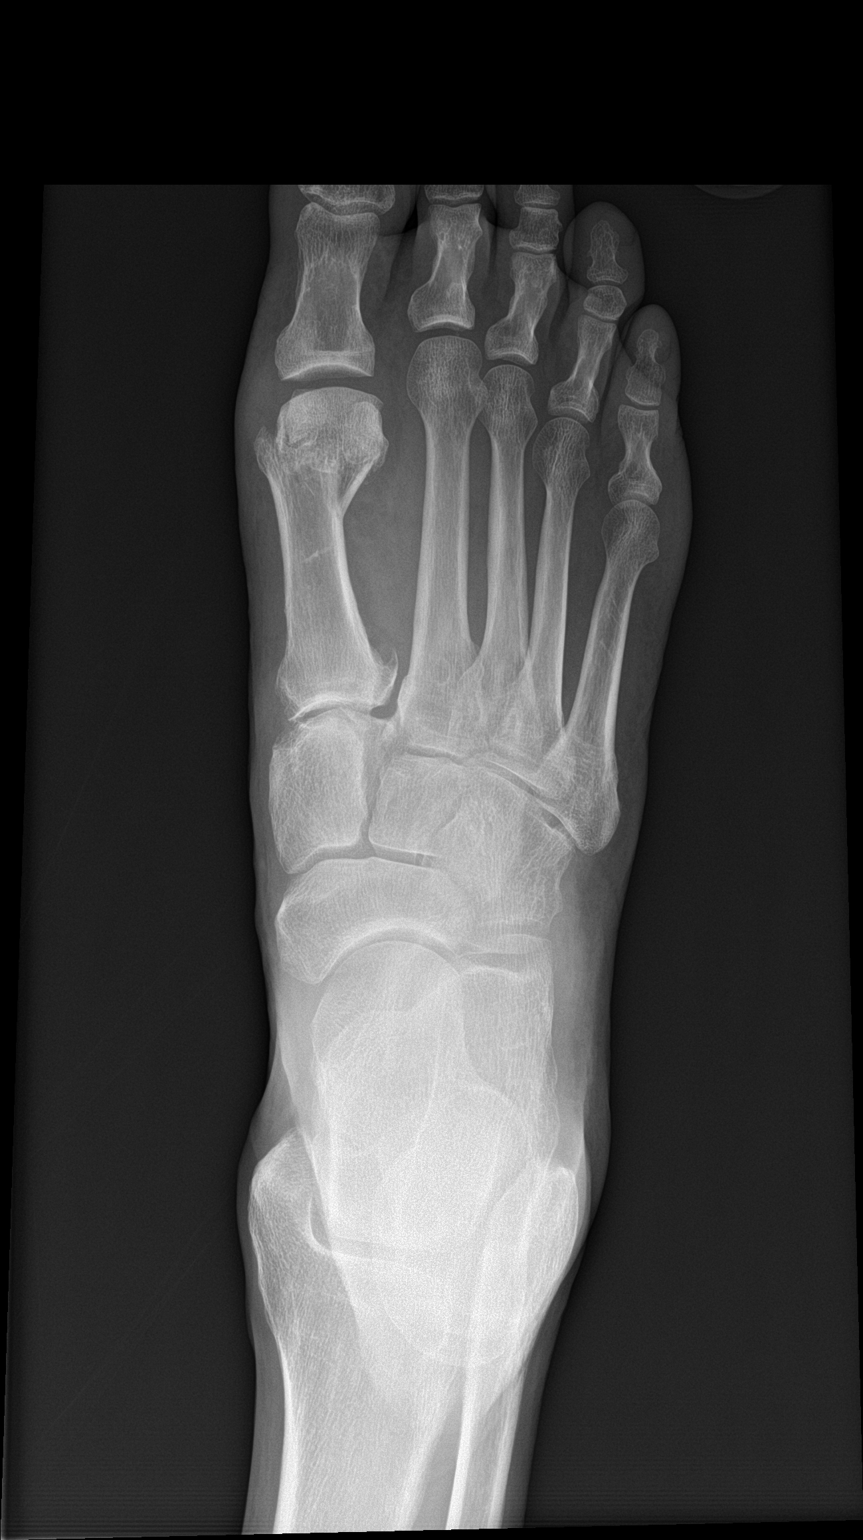

[foot obl]
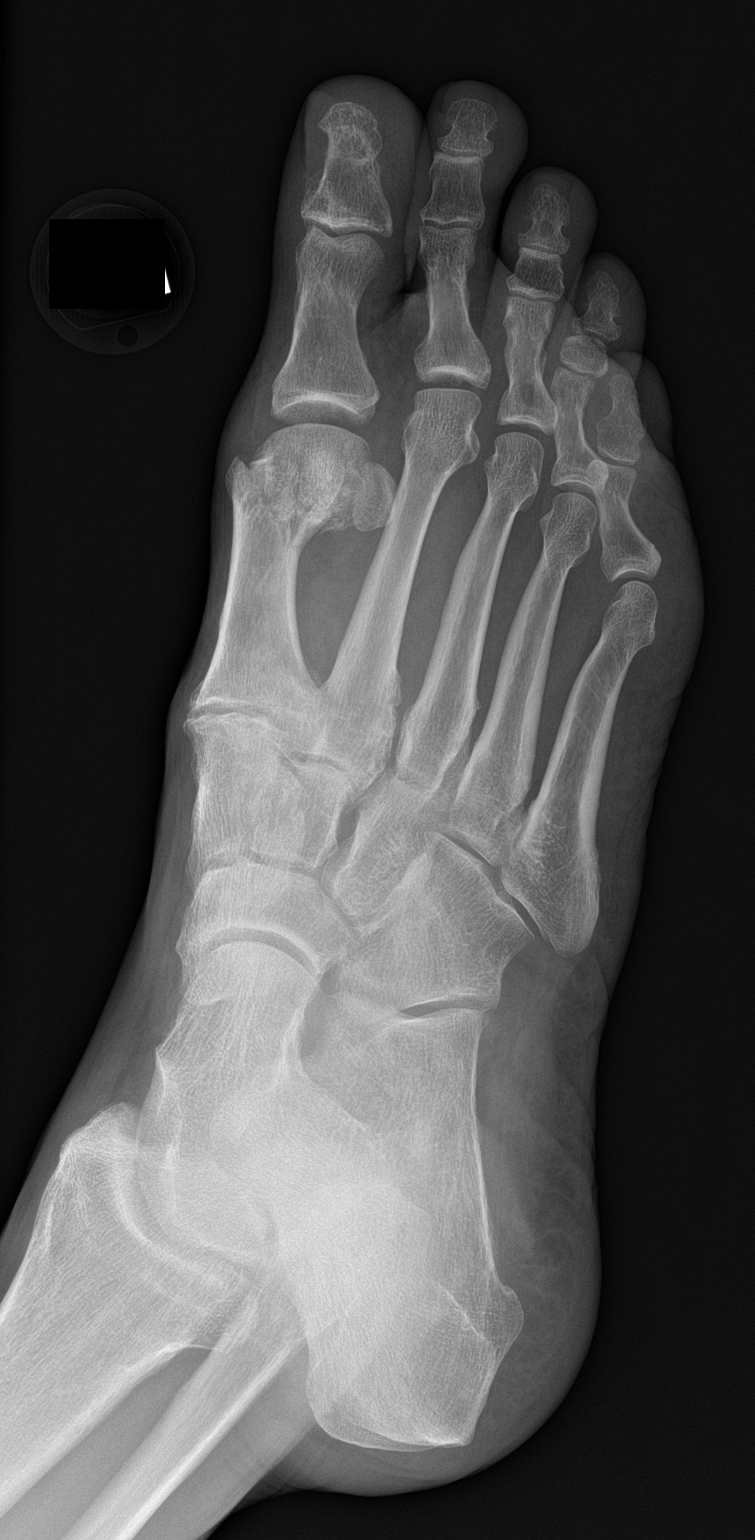

[foot lat]
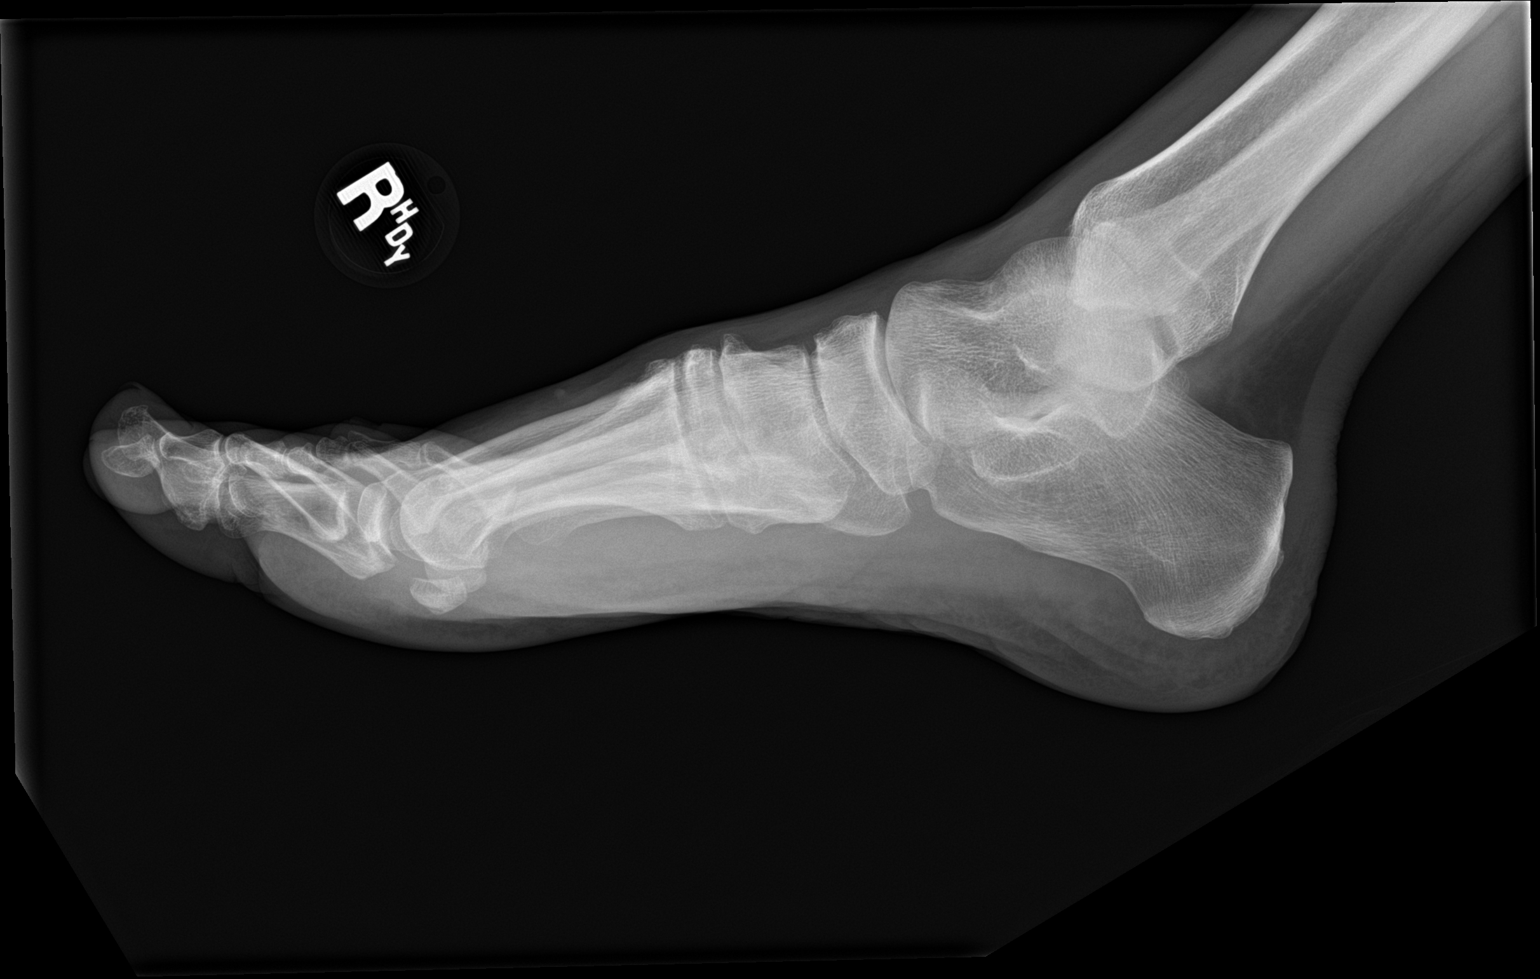

[3 of 3 positions shown; findings below may reference images not displayed]

FINDINGS: There is a comminuted, angulated fracture of the distal right first
metatarsal. No other acute fracture or dislocation. There is
probable chronic sequelae of prior Lisfranc injury to the first and
second metatarsal interval. Soft tissues are unremarkable.
IMPRESSION: There is a comminuted, angulated fracture of the distal right first
metatarsal.

## 2021-06-12 ENCOUNTER — Encounter: Payer: Self-pay | Admitting: Gastroenterology
